# Patient Record
Sex: Female | Born: 1950 | Race: White | Hispanic: No | Smoking: Never smoker
Health system: Southern US, Community
[De-identification: ages and names within clinical notes are randomized; demographics above are authoritative.]

## PROBLEM LIST (undated history)

## (undated) DIAGNOSIS — I10 Essential (primary) hypertension: Secondary | ICD-10-CM

## (undated) DIAGNOSIS — J45909 Unspecified asthma, uncomplicated: Secondary | ICD-10-CM

## (undated) DIAGNOSIS — D649 Anemia, unspecified: Secondary | ICD-10-CM

## (undated) DIAGNOSIS — R011 Cardiac murmur, unspecified: Secondary | ICD-10-CM

## (undated) DIAGNOSIS — E785 Hyperlipidemia, unspecified: Secondary | ICD-10-CM

## (undated) DIAGNOSIS — K862 Cyst of pancreas: Secondary | ICD-10-CM

## (undated) DIAGNOSIS — R002 Palpitations: Secondary | ICD-10-CM

## (undated) DIAGNOSIS — I340 Nonrheumatic mitral (valve) insufficiency: Secondary | ICD-10-CM

## (undated) DIAGNOSIS — K863 Pseudocyst of pancreas: Secondary | ICD-10-CM

## (undated) DIAGNOSIS — K219 Gastro-esophageal reflux disease without esophagitis: Secondary | ICD-10-CM

## (undated) DIAGNOSIS — K589 Irritable bowel syndrome without diarrhea: Secondary | ICD-10-CM

## (undated) DIAGNOSIS — E041 Nontoxic single thyroid nodule: Secondary | ICD-10-CM

## (undated) DIAGNOSIS — R059 Cough, unspecified: Secondary | ICD-10-CM

## (undated) DIAGNOSIS — M1990 Unspecified osteoarthritis, unspecified site: Secondary | ICD-10-CM

## (undated) DIAGNOSIS — T7840XA Allergy, unspecified, initial encounter: Secondary | ICD-10-CM

## (undated) DIAGNOSIS — F419 Anxiety disorder, unspecified: Secondary | ICD-10-CM

## (undated) DIAGNOSIS — E1129 Type 2 diabetes mellitus with other diabetic kidney complication: Secondary | ICD-10-CM

## (undated) DIAGNOSIS — I209 Angina pectoris, unspecified: Secondary | ICD-10-CM

## (undated) DIAGNOSIS — N200 Calculus of kidney: Secondary | ICD-10-CM

## (undated) DIAGNOSIS — R062 Wheezing: Secondary | ICD-10-CM

## (undated) DIAGNOSIS — R05 Cough: Secondary | ICD-10-CM

## (undated) DIAGNOSIS — Z6841 Body Mass Index (BMI) 40.0 and over, adult: Secondary | ICD-10-CM

## (undated) HISTORY — PX: TONSILLECTOMY: SUR1361

## (undated) HISTORY — DX: Pseudocyst of pancreas: K86.3

## (undated) HISTORY — DX: Gastro-esophageal reflux disease without esophagitis: K21.9

## (undated) HISTORY — DX: Nonrheumatic mitral (valve) insufficiency: I34.0

## (undated) HISTORY — DX: Body Mass Index (BMI) 40.0 and over, adult: Z684

## (undated) HISTORY — DX: Calculus of kidney: N20.0

## (undated) HISTORY — DX: Essential (primary) hypertension: I10

## (undated) HISTORY — DX: Anemia, unspecified: D64.9

## (undated) HISTORY — PX: FRACTURE SURGERY: SHX138

## (undated) HISTORY — DX: Hyperlipidemia, unspecified: E78.5

## (undated) HISTORY — PX: DILATION AND CURETTAGE OF UTERUS: SHX78

## (undated) HISTORY — DX: Unspecified osteoarthritis, unspecified site: M19.90

## (undated) HISTORY — DX: Type 2 diabetes mellitus with other diabetic kidney complication: E11.29

## (undated) HISTORY — DX: Allergy, unspecified, initial encounter: T78.40XA

## (undated) HISTORY — DX: Unspecified asthma, uncomplicated: J45.909

## (undated) HISTORY — DX: Cyst of pancreas: K86.2

## (undated) HISTORY — DX: Anxiety disorder, unspecified: F41.9

## (undated) HISTORY — PX: TONSILLECTOMY AND ADENOIDECTOMY: SHX28

---

## 2002-10-03 HISTORY — PX: COLONOSCOPY: SHX174

## 2002-10-03 HISTORY — PX: ABDOMINAL HYSTERECTOMY: SHX81

## 2007-11-27 ENCOUNTER — Ambulatory Visit: Payer: Self-pay | Admitting: Otolaryngology

## 2008-01-08 ENCOUNTER — Ambulatory Visit: Payer: Self-pay | Admitting: Vascular Surgery

## 2008-04-21 ENCOUNTER — Ambulatory Visit: Payer: Self-pay | Admitting: Specialist

## 2008-11-11 ENCOUNTER — Ambulatory Visit: Payer: Self-pay | Admitting: Otolaryngology

## 2009-05-18 ENCOUNTER — Ambulatory Visit: Payer: Self-pay | Admitting: Otolaryngology

## 2010-05-31 ENCOUNTER — Ambulatory Visit: Payer: Self-pay | Admitting: Otolaryngology

## 2011-02-23 ENCOUNTER — Ambulatory Visit: Payer: Self-pay | Admitting: Nephrology

## 2011-04-26 ENCOUNTER — Ambulatory Visit: Payer: Self-pay | Admitting: Nephrology

## 2011-10-04 HISTORY — PX: CHOLECYSTECTOMY: SHX55

## 2012-05-23 ENCOUNTER — Ambulatory Visit: Payer: Self-pay | Admitting: Family Medicine

## 2012-06-07 ENCOUNTER — Ambulatory Visit: Payer: Self-pay | Admitting: Family Medicine

## 2012-08-23 DIAGNOSIS — E669 Obesity, unspecified: Secondary | ICD-10-CM | POA: Insufficient documentation

## 2012-12-10 ENCOUNTER — Ambulatory Visit: Payer: Self-pay | Admitting: Family Medicine

## 2012-12-26 ENCOUNTER — Other Ambulatory Visit: Payer: Self-pay

## 2012-12-26 ENCOUNTER — Encounter: Payer: Self-pay | Admitting: General Surgery

## 2012-12-26 ENCOUNTER — Ambulatory Visit (INDEPENDENT_AMBULATORY_CARE_PROVIDER_SITE_OTHER): Payer: 59 | Admitting: General Surgery

## 2012-12-26 VITALS — BP 128/66 | HR 64 | Resp 16 | Ht 64.0 in | Wt 258.0 lb

## 2012-12-26 DIAGNOSIS — R928 Other abnormal and inconclusive findings on diagnostic imaging of breast: Secondary | ICD-10-CM

## 2012-12-26 DIAGNOSIS — I10 Essential (primary) hypertension: Secondary | ICD-10-CM

## 2012-12-26 DIAGNOSIS — E119 Type 2 diabetes mellitus without complications: Secondary | ICD-10-CM

## 2012-12-26 DIAGNOSIS — N63 Unspecified lump in unspecified breast: Secondary | ICD-10-CM

## 2012-12-26 DIAGNOSIS — E7800 Pure hypercholesterolemia, unspecified: Secondary | ICD-10-CM

## 2012-12-26 NOTE — Patient Instructions (Addendum)
CARE AFTER BREAST BIOPSY  1. Leave the dressing on that your doctor applied after surgery. It is waterproof. You may bathe, shower and/or swim. The dressing will probably remain intact until your return office visit. If the dressing comes off, you will see small strips of tape against your skin on the incision. Do not remove these strips.  2. You may want to use a gauze,cloth or similar protection in your bra to prevent rubbing against your dressing and incision. This is not necessary, but you may feel more comfortable doing so.  3. It is recommended that you wear a bra day and night to give support to the breast. This will prevent the weight of the breast from pulling on the incision.  4. Your breast will feel hard and lumpy under the incision. Do not be alarmed. This is the underlying stitching of tissue. Softening of this tissue will occur in time.  5. Make sure you call the office and schedule an appointment in one week after your surgery. The office phone number is [phone]. The [facility] may have already done this for you.  6. You will notice about a week after your office visit that the strips of the tape on your incision will begin to loosen. These may then be removed.  7. Report to your doctor any of the following:  * Severe pain not relieved by your pain medication  *Redness of the incision  * Drainage from the incision  *Fever greater than 101 degrees    Patient aware that once lab results are in she will receive a phone call from our office with results.

## 2012-12-26 NOTE — Progress Notes (Addendum)
Patient ID: Melissa Singh, female   DOB: 1951-04-16, 62 y.o.   MRN: [id number]  Chief Complaint  Patient presents with  . Follow-up    new patient follow up mammogram    HPI Melissa Singh is a 62 y.o. female.  The patient presents for a follow mammogram which was done on 12/10/12 at [facility] birad category 4. Patient states no known family or personal history of breast problems. She does get yearly mammograms and does regular self breast checks. No current problems with her breasts.  HPI  Past Medical History  Diagnosis Date  . Asthma   . Anemia   . Diarrhea   . Arthritis   . Kidney stone   . Sinus problem   . Hypertension   . Diabetes mellitus without complication 2001-2002    Past Surgical History  Procedure Laterality Date  . Cholecystectomy  2013  . Abdominal hysterectomy  2004    History reviewed. No pertinent family history.  Social History History  Substance Use Topics  . Smoking status: Never Smoker   . Smokeless tobacco: Never Used  . Alcohol Use: No    Allergies  Allergen Reactions  . Hydrochlorothiazide Cough  . Ultram (Tramadol) Nausea And Vomiting  . Latex Rash    Current Outpatient Prescriptions  Medication Sig Dispense Refill  . atenolol (TENORMIN) 100 MG tablet Take 100 mg by mouth daily.      Marland Kitchen azelastine (ASTELIN) 137 MCG/SPRAY nasal spray Place 1 spray into the nose 2 (two) times daily. Use in each nostril as directed      . colesevelam (WELCHOL) 625 MG tablet Take 1,875 mg by mouth 2 (two) times daily with a meal.      . Colesevelam HCl (WELCHOL PO) Take 1 tablet by mouth daily.      Marland Kitchen diltiazem (DILACOR XR) 240 MG 24 hr capsule Take 240 mg by mouth daily.      . insulin aspart (NOVOLOG) 100 UNIT/ML injection Inject 45 Units into the skin daily.       . insulin glargine (LANTUS) 100 UNIT/ML injection Inject 25 Units into the skin at bedtime.       Marland Kitchen loratadine (CLARITIN) 10 MG tablet Take 10 mg by mouth daily.      . montelukast (SINGULAIR) 10 MG  tablet Take 10 mg by mouth at bedtime.      . naproxen sodium (ANAPROX) 220 MG tablet Take 220 mg by mouth 2 (two) times daily with a meal.      . pioglitazone (ACTOS) 45 MG tablet Take 45 mg by mouth daily.      . simvastatin (ZOCOR) 40 MG tablet Take 40 mg by mouth every evening.       No current facility-administered medications for this visit.    Review of Systems Review of Systems  Constitutional: Negative.   Respiratory: Negative.   Cardiovascular: Negative.     Blood pressure 128/66, pulse 64, resp. rate 16, height 5\' 4"  (1.626 m), weight 258 lb (117.028 kg).  Physical Exam Physical Exam  Constitutional: She appears well-developed and well-nourished.  Neck: Trachea normal. No mass and no thyromegaly present.  Cardiovascular: Normal rate, regular rhythm and normal pulses.   Murmur heard. Pulmonary/Chest: Effort normal and breath sounds normal. Right breast exhibits no inverted nipple, no mass, no nipple discharge, no skin change and no tenderness. Left breast exhibits no inverted nipple, no mass, no nipple discharge, no skin change and no tenderness. Breasts are symmetrical.  Lymphadenopathy:  She has no axillary adenopathy.    Data Reviewed Laboratory studies completed 11/19/2012 included a serum creatinine. This was elevated at 1.13 with an estimated GFR 52.  Right breast mammogram dated 12/10/2012 showed a slightly more spiculated appearance compared to her in 06/07/2012 exam ultrasound showed a primary linear orientation with primarily anechoic architecture. Linear areas of increased echogenicity were again appreciated. Absent vascularity. Unchanged from past exams. Mammogram was rated a BIRAD-4.  Office ultrasound confirmed the above lesion as being present. Considering the mammographic rating back and biopsy was recommended.  Assessment    Breast nodule, mild renal impairment, non-insulin-dependent diabetes, elevated cholesterol, essential hypertension.    Plan     The patient was advised that she should minimize her use of nonsteroidals in light of her renal function.  Vacuum biopsy of the right breast was completed today without incident.       Earline Mayotte 12/29/2012, 11:34 AM    The pathology report for the biopsy completed 12/26/2012 showed no evidence of malignancy. PATH REPORT.SITE OF ORIGIN SPEC  Comment   Comments: Material submitted: . BREAST, RIGHT 5:00 CORE BIOPSY *STAT* PATH REPORT.FINAL DX SPEC  Comment   Comments: Clinician provided ICD-9: 611.72 ; Lump or mass in breast 793.80 ; Unspecified abnormal mammogram PATH REPORT.FINAL DX SPEC  Comment   Comments: Diagnosis: RIGHT BREAST AT 5:00, ULTRASOUND-GUIDED CORE BIOPSY: - BENIGN BREAST TISSUE WITH FOCAL FIBROSIS. NOTE: No epithelial proliferative change, atypia, or malignancy is seen in this specimen. The adipose tissue contains a few foci of interstitial macrophages, which could be compatible with fat necrosis, but I do not see sufficient histologic features for a specific diagnosis. Clinical correlation and follow-up are recommended. The diagnosis was reported to Dr. Lemar Livings at 1:35 PM on December 27, 2012. MSO/12/27/2012 SIGNED OUT BY:  Comment   Comments: Electronically signed: Marland Kitchen Vernona Rieger, MD, Pathologist  The patient has been notified of the results. Arrangements are in place for a follow up examination with a right breast mammogram in 6 months.

## 2012-12-27 ENCOUNTER — Telehealth: Payer: Self-pay | Admitting: General Surgery

## 2012-12-27 LAB — PATHOLOGY

## 2012-12-27 NOTE — Telephone Encounter (Signed)
Patient notified of benign results. Will follow up next week for wound check with the nurse.  MD fu in six months with repeat mammogram.

## 2012-12-29 ENCOUNTER — Encounter: Payer: Self-pay | Admitting: General Surgery

## 2012-12-29 DIAGNOSIS — E119 Type 2 diabetes mellitus without complications: Secondary | ICD-10-CM | POA: Insufficient documentation

## 2012-12-29 DIAGNOSIS — E7800 Pure hypercholesterolemia, unspecified: Secondary | ICD-10-CM | POA: Insufficient documentation

## 2012-12-29 DIAGNOSIS — I10 Essential (primary) hypertension: Secondary | ICD-10-CM | POA: Insufficient documentation

## 2013-01-02 ENCOUNTER — Ambulatory Visit (INDEPENDENT_AMBULATORY_CARE_PROVIDER_SITE_OTHER): Payer: 59 | Admitting: *Deleted

## 2013-01-02 DIAGNOSIS — N63 Unspecified lump in unspecified breast: Secondary | ICD-10-CM

## 2013-01-02 NOTE — Patient Instructions (Addendum)
Follow up as scheduled for 6 months

## 2013-01-02 NOTE — Progress Notes (Signed)
Patient here today for follow up post right  breast biopsy.  Dressing removed, steristrip in place and aware it may come off in one week.  Minimal bruising noted.  The patient is aware that a heating pad may be used for comfort as needed.  Aware of pathology. Follow up as scheduled. 

## 2013-06-11 ENCOUNTER — Ambulatory Visit: Payer: Self-pay | Admitting: General Surgery

## 2013-06-13 ENCOUNTER — Encounter: Payer: Self-pay | Admitting: General Surgery

## 2013-06-24 ENCOUNTER — Ambulatory Visit: Payer: 59 | Admitting: General Surgery

## 2013-06-24 ENCOUNTER — Encounter: Payer: Self-pay | Admitting: General Surgery

## 2013-06-24 ENCOUNTER — Ambulatory Visit (INDEPENDENT_AMBULATORY_CARE_PROVIDER_SITE_OTHER): Payer: 59 | Admitting: General Surgery

## 2013-06-24 VITALS — BP 136/70 | HR 78 | Resp 14 | Ht 64.0 in | Wt 254.0 lb

## 2013-06-24 DIAGNOSIS — N63 Unspecified lump in unspecified breast: Secondary | ICD-10-CM

## 2013-06-24 DIAGNOSIS — Z1211 Encounter for screening for malignant neoplasm of colon: Secondary | ICD-10-CM

## 2013-06-24 MED ORDER — POLYETHYLENE GLYCOL 3350 17 GM/SCOOP PO POWD: ORAL | Status: DC

## 2013-06-24 NOTE — Progress Notes (Signed)
Patient ID: Melissa Singh, female   DOB: 21-Sep-1951, 62 y.o.   MRN: [id number]  Chief Complaint  Patient presents with  . Follow-up    mammogram    HPI Melissa Singh is a 62 y.o. female who presents for a breast evaluation. The most recent mammogram was done on 06/13/13 cat 2.  Patient does perform regular self breast checks and gets regular mammograms done.    HPI  Past Medical History  Diagnosis Date  . Asthma   . Anemia   . Diarrhea   . Arthritis   . Kidney stone   . Sinus problem   . Hypertension   . Diabetes mellitus without complication 2001-2002    Past Surgical History  Procedure Laterality Date  . Cholecystectomy  2013  . Abdominal hysterectomy  2004  . Colonoscopy  2004    History reviewed. No pertinent family history.  Social History History  Substance Use Topics  . Smoking status: Never Smoker   . Smokeless tobacco: Never Used  . Alcohol Use: No    Allergies  Allergen Reactions  . Hydrochlorothiazide Cough  . Ultram [Tramadol] Nausea And Vomiting  . Latex Rash    Current Outpatient Prescriptions  Medication Sig Dispense Refill  . atenolol (TENORMIN) 100 MG tablet Take 100 mg by mouth daily.      Marland Kitchen azelastine (ASTELIN) 137 MCG/SPRAY nasal spray Place 1 spray into the nose 2 (two) times daily. Use in each nostril as directed      . colesevelam (WELCHOL) 625 MG tablet Take 1,875 mg by mouth 2 (two) times daily with a meal.      . Colesevelam HCl (WELCHOL PO) Take 1 tablet by mouth daily.      Marland Kitchen diltiazem (CARDIZEM CD) 240 MG 24 hr capsule Take 1 capsule by mouth daily.      Marland Kitchen diltiazem (DILACOR XR) 240 MG 24 hr capsule Take 240 mg by mouth daily.      Marland Kitchen FARXIGA 5 MG TABS Take 1 tablet by mouth daily.      . insulin aspart (NOVOLOG) 100 UNIT/ML injection Inject 45 Units into the skin daily.       . insulin glargine (LANTUS) 100 UNIT/ML injection Inject 25 Units into the skin at bedtime.       Marland Kitchen loratadine (CLARITIN) 10 MG tablet Take 10 mg by mouth  daily.      . meloxicam (MOBIC) 15 MG tablet Take 1 tablet by mouth daily.      . montelukast (SINGULAIR) 10 MG tablet Take 10 mg by mouth at bedtime.      . naproxen sodium (ANAPROX) 220 MG tablet Take 220 mg by mouth 2 (two) times daily with a meal.      . pioglitazone (ACTOS) 45 MG tablet Take 45 mg by mouth daily.      . simvastatin (ZOCOR) 40 MG tablet Take 40 mg by mouth every evening.      . polyethylene glycol powder (GLYCOLAX/MIRALAX) powder 255 grams one bottle for colonoscopy prep  255 g  0   No current facility-administered medications for this visit.    Review of Systems Review of Systems  Constitutional: Negative.   Respiratory: Negative.   Cardiovascular: Negative.   Gastrointestinal: Negative.     Blood pressure 136/70, pulse 78, resp. rate 14, height 5\' 4"  (1.626 m), weight 254 lb (115.214 kg).  Physical Exam Physical Exam  Constitutional: She is oriented to person, place, and time. She appears well-developed and well-nourished.  Cardiovascular:  Normal rate, regular rhythm and normal heart sounds.   Pulmonary/Chest: Breath sounds normal. Right breast exhibits no inverted nipple, no mass, no nipple discharge, no skin change and no tenderness. Left breast exhibits no inverted nipple, no mass, no nipple discharge, no skin change and no tenderness.  Lymphadenopathy:    She has no cervical adenopathy.    She has no axillary adenopathy.  Neurological: She is alert and oriented to person, place, and time.  Skin: Skin is warm and dry.    Data Reviewed Bilateral mammogram and ultrasound of the right breast is 06/11/2013 was reviewed. Left breast is unremarkable. The right breast shows minimal residual nodularity with a biopsy clip adjacent. Microcalcifications. BI-RAD-2.  Colonoscopy dated 08/30/2002 completed for iron deficiency anemia was normal.  Upper endoscopy completed the same date show some granular mucosa in the second portion of the duodenum.  Small bowel  follow-through showed no evidence of disease or source for blood loss dated 09/03/2002.  Assessment    Benign breast exam. Stable mammogram. 10 year since previous colonoscopy.    Plan    The patient desired to proceed with a screening colonoscopy. Her evening NovoLog insulin will be decreased to 10 units evening of the prep. Her bedtime dose of Lantus will be decreased to 10 units. She will continue on Actos.    Patient has been scheduled for a colonoscopy on 08-14-13 at [facility]. This patient has been asked to decrease Novolog to 10 units, Lantus to 10 units, and okay to take Actos as she normally would day of colonoscopy prep. She will hold diabetic meds the morning of procedure.   Earline Mayotte 06/25/2013, 9:50 AM

## 2013-06-24 NOTE — Patient Instructions (Addendum)
Colonoscopy A colonoscopy is an exam to evaluate your entire colon. In this exam, your colon is cleansed. A Preble fiberoptic tube is inserted through your rectum and into your colon. The fiberoptic scope (endoscope) is a Futch bundle of enclosed and very flexible fibers. These fibers transmit light to the area examined and send images from that area to your caregiver. Discomfort is usually minimal. You may be given a drug to help you sleep (sedative) during or prior to the procedure. This exam helps to detect lumps (tumors), polyps, inflammation, and areas of bleeding. Your caregiver may also take a small piece of tissue (biopsy) that will be examined under a microscope. LET YOUR CAREGIVER KNOW ABOUT:   Allergies to food or medicine.  Medicines taken, including vitamins, herbs, eyedrops, over-the-counter medicines, and creams.  Use of steroids (by mouth or creams).  Previous problems with anesthetics or numbing medicines.  History of bleeding problems or blood clots.  Previous surgery.  Other health problems, including diabetes and kidney problems.  Possibility of pregnancy, if this applies. BEFORE THE PROCEDURE   A clear liquid diet may be required for 2 days before the exam.  Ask your caregiver about changing or stopping your regular medications.  Liquid injections (enemas) or laxatives may be required.  A large amount of electrolyte solution may be given to you to drink over a short period of time. This solution is used to clean out your colon.  You should be present 60 minutes prior to your procedure or as directed by your caregiver. AFTER THE PROCEDURE   If you received a sedative or pain relieving medication, you will need to arrange for someone to drive you home.  Occasionally, there is a little blood passed with the first bowel movement. Do not be concerned. FINDING OUT THE RESULTS OF YOUR TEST Not all test results are available during your visit. If your test results are  not back during the visit, make an appointment with your caregiver to find out the results. Do not assume everything is normal if you have not heard from your caregiver or the medical facility. It is important for you to follow up on all of your test results. HOME CARE INSTRUCTIONS   It is not unusual to pass moderate amounts of gas and experience mild abdominal cramping following the procedure. This is due to air being used to inflate your colon during the exam. Walking or a warm pack on your belly (abdomen) may help.  You may resume all normal meals and activities after sedatives and medicines have worn off.  Only take over-the-counter or prescription medicines for pain, discomfort, or fever as directed by your caregiver. Do not use aspirin or blood thinners if a biopsy was taken. Consult your caregiver for medicine usage if biopsies were taken. SEEK IMMEDIATE MEDICAL CARE IF:   You have a fever.  You pass large blood clots or fill a toilet with blood following the procedure. This may also occur 10 to 14 days following the procedure. This is more likely if a biopsy was taken.  You develop abdominal pain that keeps getting worse and cannot be relieved with medicine. Document Released: 09/16/2000 Document Revised: 12/12/2011 Document Reviewed: 05/01/2008 Exit[employer] Patient Information 2014 [employer].  Patient has been scheduled for a colonoscopy on 08-14-13 at [facility]. 

## 2013-06-25 ENCOUNTER — Other Ambulatory Visit: Payer: Self-pay | Admitting: General Surgery

## 2013-06-25 ENCOUNTER — Encounter: Payer: Self-pay | Admitting: General Surgery

## 2013-06-25 DIAGNOSIS — Z1211 Encounter for screening for malignant neoplasm of colon: Secondary | ICD-10-CM

## 2013-08-08 ENCOUNTER — Telehealth: Payer: Self-pay | Admitting: *Deleted

## 2013-08-08 ENCOUNTER — Other Ambulatory Visit: Payer: Self-pay

## 2013-08-08 NOTE — Telephone Encounter (Signed)
Left message on home and cell numbers for patient to call the office.  We need to confirm no medication changes since last office visit. She is presently scheduled for a colonoscopy on 08-14-13 at [facility].

## 2013-08-09 NOTE — Telephone Encounter (Signed)
Spoke with patient and confirmed that she has had no medication changes. She says that she has pre registered at [facility] and she has picked up her Mira lax prescription. She is aware of instructions and date of colonoscopy. Patient will call us with any questions or changes in medical status.

## 2013-08-14 ENCOUNTER — Ambulatory Visit: Payer: Self-pay | Admitting: General Surgery

## 2013-08-14 DIAGNOSIS — Z1211 Encounter for screening for malignant neoplasm of colon: Secondary | ICD-10-CM

## 2013-08-15 ENCOUNTER — Encounter: Payer: Self-pay | Admitting: General Surgery

## 2014-06-16 DIAGNOSIS — J45909 Unspecified asthma, uncomplicated: Secondary | ICD-10-CM | POA: Diagnosis not present

## 2014-06-26 DIAGNOSIS — I059 Rheumatic mitral valve disease, unspecified: Secondary | ICD-10-CM | POA: Diagnosis not present

## 2014-06-26 DIAGNOSIS — N182 Chronic kidney disease, stage 2 (mild): Secondary | ICD-10-CM | POA: Diagnosis not present

## 2014-06-26 DIAGNOSIS — E1129 Type 2 diabetes mellitus with other diabetic kidney complication: Secondary | ICD-10-CM | POA: Diagnosis not present

## 2014-06-26 DIAGNOSIS — E785 Hyperlipidemia, unspecified: Secondary | ICD-10-CM | POA: Diagnosis not present

## 2014-06-26 DIAGNOSIS — Z23 Encounter for immunization: Secondary | ICD-10-CM | POA: Diagnosis not present

## 2014-06-26 DIAGNOSIS — E1165 Type 2 diabetes mellitus with hyperglycemia: Secondary | ICD-10-CM | POA: Diagnosis not present

## 2014-06-26 DIAGNOSIS — I10 Essential (primary) hypertension: Secondary | ICD-10-CM | POA: Diagnosis not present

## 2014-06-26 DIAGNOSIS — E119 Type 2 diabetes mellitus without complications: Secondary | ICD-10-CM | POA: Diagnosis not present

## 2014-06-26 DIAGNOSIS — Z0000 Encounter for general adult medical examination without abnormal findings: Secondary | ICD-10-CM | POA: Diagnosis not present

## 2014-06-26 DIAGNOSIS — Z6841 Body Mass Index (BMI) 40.0 and over, adult: Secondary | ICD-10-CM | POA: Diagnosis not present

## 2014-07-07 ENCOUNTER — Ambulatory Visit: Payer: Self-pay | Admitting: Family Medicine

## 2014-07-07 DIAGNOSIS — Z8249 Family history of ischemic heart disease and other diseases of the circulatory system: Secondary | ICD-10-CM | POA: Diagnosis not present

## 2014-07-07 DIAGNOSIS — Z136 Encounter for screening for cardiovascular disorders: Secondary | ICD-10-CM | POA: Diagnosis not present

## 2014-07-21 DIAGNOSIS — K219 Gastro-esophageal reflux disease without esophagitis: Secondary | ICD-10-CM | POA: Diagnosis not present

## 2014-07-21 DIAGNOSIS — E119 Type 2 diabetes mellitus without complications: Secondary | ICD-10-CM | POA: Diagnosis not present

## 2014-07-21 DIAGNOSIS — I38 Endocarditis, valve unspecified: Secondary | ICD-10-CM | POA: Diagnosis not present

## 2014-07-21 DIAGNOSIS — I10 Essential (primary) hypertension: Secondary | ICD-10-CM | POA: Diagnosis not present

## 2014-08-04 ENCOUNTER — Encounter: Payer: Self-pay | Admitting: General Surgery

## 2014-09-01 ENCOUNTER — Ambulatory Visit: Payer: Self-pay | Admitting: Family Medicine

## 2014-09-01 DIAGNOSIS — M898X9 Other specified disorders of bone, unspecified site: Secondary | ICD-10-CM | POA: Diagnosis not present

## 2014-09-01 DIAGNOSIS — Z8249 Family history of ischemic heart disease and other diseases of the circulatory system: Secondary | ICD-10-CM | POA: Diagnosis not present

## 2014-09-01 DIAGNOSIS — Z780 Asymptomatic menopausal state: Secondary | ICD-10-CM | POA: Diagnosis not present

## 2014-09-01 DIAGNOSIS — Z13820 Encounter for screening for osteoporosis: Secondary | ICD-10-CM | POA: Diagnosis not present

## 2014-09-01 DIAGNOSIS — Z1231 Encounter for screening mammogram for malignant neoplasm of breast: Secondary | ICD-10-CM | POA: Diagnosis not present

## 2014-09-01 DIAGNOSIS — M8580 Other specified disorders of bone density and structure, unspecified site: Secondary | ICD-10-CM | POA: Diagnosis not present

## 2014-09-15 DIAGNOSIS — L561 Drug photoallergic response: Secondary | ICD-10-CM | POA: Diagnosis not present

## 2014-09-15 DIAGNOSIS — J45909 Unspecified asthma, uncomplicated: Secondary | ICD-10-CM | POA: Diagnosis not present

## 2014-09-15 DIAGNOSIS — I10 Essential (primary) hypertension: Secondary | ICD-10-CM | POA: Diagnosis not present

## 2014-09-15 DIAGNOSIS — K863 Pseudocyst of pancreas: Secondary | ICD-10-CM | POA: Diagnosis not present

## 2014-09-15 DIAGNOSIS — Z6841 Body Mass Index (BMI) 40.0 and over, adult: Secondary | ICD-10-CM | POA: Diagnosis not present

## 2014-09-15 DIAGNOSIS — F419 Anxiety disorder, unspecified: Secondary | ICD-10-CM | POA: Diagnosis not present

## 2014-09-15 DIAGNOSIS — E1129 Type 2 diabetes mellitus with other diabetic kidney complication: Secondary | ICD-10-CM | POA: Diagnosis not present

## 2014-09-15 DIAGNOSIS — E785 Hyperlipidemia, unspecified: Secondary | ICD-10-CM | POA: Diagnosis not present

## 2014-12-22 DIAGNOSIS — E782 Mixed hyperlipidemia: Secondary | ICD-10-CM | POA: Diagnosis not present

## 2014-12-22 DIAGNOSIS — I340 Nonrheumatic mitral (valve) insufficiency: Secondary | ICD-10-CM | POA: Diagnosis not present

## 2014-12-22 DIAGNOSIS — I10 Essential (primary) hypertension: Secondary | ICD-10-CM | POA: Diagnosis not present

## 2014-12-29 DIAGNOSIS — I10 Essential (primary) hypertension: Secondary | ICD-10-CM | POA: Diagnosis not present

## 2014-12-29 DIAGNOSIS — E1129 Type 2 diabetes mellitus with other diabetic kidney complication: Secondary | ICD-10-CM | POA: Diagnosis not present

## 2014-12-29 DIAGNOSIS — E785 Hyperlipidemia, unspecified: Secondary | ICD-10-CM | POA: Diagnosis not present

## 2015-01-12 DIAGNOSIS — J4520 Mild intermittent asthma, uncomplicated: Secondary | ICD-10-CM | POA: Diagnosis not present

## 2015-01-12 DIAGNOSIS — E6609 Other obesity due to excess calories: Secondary | ICD-10-CM | POA: Diagnosis not present

## 2015-01-12 DIAGNOSIS — J310 Chronic rhinitis: Secondary | ICD-10-CM | POA: Diagnosis not present

## 2015-01-12 DIAGNOSIS — I10 Essential (primary) hypertension: Secondary | ICD-10-CM | POA: Diagnosis not present

## 2015-01-12 DIAGNOSIS — E782 Mixed hyperlipidemia: Secondary | ICD-10-CM | POA: Diagnosis not present

## 2015-01-12 DIAGNOSIS — R42 Dizziness and giddiness: Secondary | ICD-10-CM | POA: Diagnosis not present

## 2015-02-20 DIAGNOSIS — K862 Cyst of pancreas: Secondary | ICD-10-CM | POA: Diagnosis not present

## 2015-02-20 DIAGNOSIS — Z9049 Acquired absence of other specified parts of digestive tract: Secondary | ICD-10-CM | POA: Diagnosis not present

## 2015-02-20 DIAGNOSIS — N281 Cyst of kidney, acquired: Secondary | ICD-10-CM | POA: Diagnosis not present

## 2015-03-01 DIAGNOSIS — S52572A Other intraarticular fracture of lower end of left radius, initial encounter for closed fracture: Secondary | ICD-10-CM | POA: Diagnosis not present

## 2015-03-01 DIAGNOSIS — S52502A Unspecified fracture of the lower end of left radius, initial encounter for closed fracture: Secondary | ICD-10-CM | POA: Diagnosis not present

## 2015-03-01 DIAGNOSIS — M19032 Primary osteoarthritis, left wrist: Secondary | ICD-10-CM | POA: Diagnosis not present

## 2015-03-01 DIAGNOSIS — M8580 Other specified disorders of bone density and structure, unspecified site: Secondary | ICD-10-CM | POA: Diagnosis not present

## 2015-03-01 DIAGNOSIS — S52615A Nondisplaced fracture of left ulna styloid process, initial encounter for closed fracture: Secondary | ICD-10-CM | POA: Diagnosis not present

## 2015-03-01 DIAGNOSIS — S52512A Displaced fracture of left radial styloid process, initial encounter for closed fracture: Secondary | ICD-10-CM | POA: Diagnosis not present

## 2015-03-01 DIAGNOSIS — S52612A Displaced fracture of left ulna styloid process, initial encounter for closed fracture: Secondary | ICD-10-CM | POA: Diagnosis not present

## 2015-03-04 HISTORY — PX: OTHER SURGICAL HISTORY: SHX169

## 2015-03-06 DIAGNOSIS — M25532 Pain in left wrist: Secondary | ICD-10-CM | POA: Diagnosis not present

## 2015-03-06 DIAGNOSIS — S52502A Unspecified fracture of the lower end of left radius, initial encounter for closed fracture: Secondary | ICD-10-CM | POA: Diagnosis not present

## 2015-03-09 DIAGNOSIS — S52572A Other intraarticular fracture of lower end of left radius, initial encounter for closed fracture: Secondary | ICD-10-CM | POA: Diagnosis not present

## 2015-03-12 DIAGNOSIS — I10 Essential (primary) hypertension: Secondary | ICD-10-CM | POA: Diagnosis not present

## 2015-03-12 DIAGNOSIS — S52502A Unspecified fracture of the lower end of left radius, initial encounter for closed fracture: Secondary | ICD-10-CM | POA: Diagnosis not present

## 2015-03-12 DIAGNOSIS — E119 Type 2 diabetes mellitus without complications: Secondary | ICD-10-CM | POA: Diagnosis not present

## 2015-03-12 DIAGNOSIS — S52502D Unspecified fracture of the lower end of left radius, subsequent encounter for closed fracture with routine healing: Secondary | ICD-10-CM | POA: Diagnosis not present

## 2015-03-12 DIAGNOSIS — I340 Nonrheumatic mitral (valve) insufficiency: Secondary | ICD-10-CM | POA: Diagnosis not present

## 2015-03-13 DIAGNOSIS — I340 Nonrheumatic mitral (valve) insufficiency: Secondary | ICD-10-CM | POA: Diagnosis not present

## 2015-03-13 DIAGNOSIS — S52502A Unspecified fracture of the lower end of left radius, initial encounter for closed fracture: Secondary | ICD-10-CM | POA: Diagnosis not present

## 2015-03-13 DIAGNOSIS — S52572A Other intraarticular fracture of lower end of left radius, initial encounter for closed fracture: Secondary | ICD-10-CM | POA: Diagnosis not present

## 2015-03-13 DIAGNOSIS — I272 Other secondary pulmonary hypertension: Secondary | ICD-10-CM | POA: Diagnosis not present

## 2015-03-13 DIAGNOSIS — Z91040 Latex allergy status: Secondary | ICD-10-CM | POA: Diagnosis not present

## 2015-03-13 DIAGNOSIS — Z888 Allergy status to other drugs, medicaments and biological substances status: Secondary | ICD-10-CM | POA: Diagnosis not present

## 2015-03-13 DIAGNOSIS — Z794 Long term (current) use of insulin: Secondary | ICD-10-CM | POA: Diagnosis not present

## 2015-03-13 DIAGNOSIS — Z6841 Body Mass Index (BMI) 40.0 and over, adult: Secondary | ICD-10-CM | POA: Diagnosis not present

## 2015-03-13 DIAGNOSIS — Z79899 Other long term (current) drug therapy: Secondary | ICD-10-CM | POA: Diagnosis not present

## 2015-03-13 DIAGNOSIS — E119 Type 2 diabetes mellitus without complications: Secondary | ICD-10-CM | POA: Diagnosis not present

## 2015-03-13 DIAGNOSIS — I10 Essential (primary) hypertension: Secondary | ICD-10-CM | POA: Diagnosis not present

## 2015-03-27 DIAGNOSIS — M25632 Stiffness of left wrist, not elsewhere classified: Secondary | ICD-10-CM | POA: Diagnosis not present

## 2015-03-27 DIAGNOSIS — S62102S Fracture of unspecified carpal bone, left wrist, sequela: Secondary | ICD-10-CM | POA: Diagnosis not present

## 2015-03-27 DIAGNOSIS — E1129 Type 2 diabetes mellitus with other diabetic kidney complication: Secondary | ICD-10-CM | POA: Insufficient documentation

## 2015-03-27 DIAGNOSIS — Z736 Limitation of activities due to disability: Secondary | ICD-10-CM | POA: Diagnosis not present

## 2015-03-27 DIAGNOSIS — R600 Localized edema: Secondary | ICD-10-CM | POA: Diagnosis not present

## 2015-03-30 ENCOUNTER — Encounter: Payer: Self-pay | Admitting: Family Medicine

## 2015-03-30 ENCOUNTER — Ambulatory Visit (INDEPENDENT_AMBULATORY_CARE_PROVIDER_SITE_OTHER): Payer: Medicare Other | Admitting: Family Medicine

## 2015-03-30 VITALS — BP 124/72 | HR 73 | Temp 97.8°F | Ht 65.0 in | Wt 251.0 lb

## 2015-03-30 DIAGNOSIS — I10 Essential (primary) hypertension: Secondary | ICD-10-CM | POA: Diagnosis not present

## 2015-03-30 DIAGNOSIS — Z6841 Body Mass Index (BMI) 40.0 and over, adult: Secondary | ICD-10-CM | POA: Diagnosis not present

## 2015-03-30 DIAGNOSIS — E7800 Pure hypercholesterolemia, unspecified: Secondary | ICD-10-CM

## 2015-03-30 DIAGNOSIS — N189 Chronic kidney disease, unspecified: Secondary | ICD-10-CM | POA: Diagnosis not present

## 2015-03-30 DIAGNOSIS — E1122 Type 2 diabetes mellitus with diabetic chronic kidney disease: Secondary | ICD-10-CM | POA: Diagnosis not present

## 2015-03-30 DIAGNOSIS — E1129 Type 2 diabetes mellitus with other diabetic kidney complication: Secondary | ICD-10-CM

## 2015-03-30 LAB — BAYER DCA HB A1C WAIVED: HB A1C: 7.1 % — AB (ref <7.0)

## 2015-03-30 NOTE — Assessment & Plan Note (Signed)
The current medical regimen is effective;  continue present plan and medications.  

## 2015-03-30 NOTE — Assessment & Plan Note (Signed)
Discussed diet and exercise and nutrition

## 2015-03-30 NOTE — Progress Notes (Signed)
   BP 124/72 mmHg  Pulse 73  Temp(Src) 97.8 F (36.6 C)  Ht 5\' 5"  (1.651 m)  Wt 251 lb (113.853 kg)  BMI 41.77 kg/m2  SpO2 96%   Subjective:    Patient ID: Melissa Singh, female    DOB: 1951-03-23, 64 y.o.   MRN: [id number]  HPI: Melissa Singh is a 64 y.o. female  Chief Complaint  Patient presents with  . Diabetes  Htn Lipids All ok No hypoglycemia Takes meds every day So side effects Did break wrist last week and limited diet   Relevant past medical, surgical, family and social history reviewed and updated as indicated. Interim medical history since our last visit reviewed. Allergies and medications reviewed and updated.  Review of Systems  Constitutional: Negative.   Respiratory: Negative.   Cardiovascular: Negative.   Musculoskeletal:       Wrist wound doing well stopped weeping     Per HPI unless specifically indicated above     Objective:    BP 124/72 mmHg  Pulse 73  Temp(Src) 97.8 F (36.6 C)  Ht 5\' 5"  (1.651 m)  Wt 251 lb (113.853 kg)  BMI 41.77 kg/m2  SpO2 96%  Wt Readings from Last 3 Encounters:  03/30/15 251 lb (113.853 kg)  12/29/14 251 lb (113.853 kg)  06/24/13 254 lb (115.214 kg)    Physical Exam  Constitutional: She is oriented to person, place, and time. She appears well-developed and well-nourished. No distress.  HENT:  Head: Normocephalic and atraumatic.  Right Ear: Hearing normal.  Left Ear: Hearing normal.  Nose: Nose normal.  Eyes: Conjunctivae and lids are normal. Right eye exhibits no discharge. Left eye exhibits no discharge. No scleral icterus.  Cardiovascular: Normal rate, regular rhythm and normal heart sounds.   Pulmonary/Chest: Effort normal and breath sounds normal. No respiratory distress.  Musculoskeletal: Normal range of motion.  Lt wrist wound healing well no sign of infection   Neurological: She is alert and oriented to person, place, and time.  Skin: Skin is intact. No rash noted.  Psychiatric: She has a normal  mood and affect. Her speech is normal and behavior is normal. Judgment and thought content normal. Cognition and memory are normal.        Assessment & Plan:   Problem List Items Addressed This Visit      Cardiovascular and Mediastinum   Essential hypertension    The current medical regimen is effective;  continue present plan and medications.         Endocrine   Diabetes mellitus - Primary   Relevant Orders   Bayer DCA Hb A1c Waived   Type II diabetes mellitus with renal manifestations    The current medical regimen is effective;  continue present plan and medications.       Relevant Orders   Bayer DCA Hb A1c Waived     Other   Body mass index 40.0-44.9, adult (Chronic)    Discussed diet and exercise and nutrition       Elevated cholesterol    The current medical regimen is effective;  continue present plan and medications.           Follow up plan: Return in about 3 months (around 06/30/2015) for Physical Exam labs and a1c.

## 2015-04-15 DIAGNOSIS — H25013 Cortical age-related cataract, bilateral: Secondary | ICD-10-CM | POA: Diagnosis not present

## 2015-04-20 DIAGNOSIS — S62102S Fracture of unspecified carpal bone, left wrist, sequela: Secondary | ICD-10-CM | POA: Diagnosis not present

## 2015-04-27 DIAGNOSIS — M25532 Pain in left wrist: Secondary | ICD-10-CM | POA: Diagnosis not present

## 2015-04-27 DIAGNOSIS — S52502D Unspecified fracture of the lower end of left radius, subsequent encounter for closed fracture with routine healing: Secondary | ICD-10-CM | POA: Diagnosis not present

## 2015-04-28 DIAGNOSIS — K862 Cyst of pancreas: Secondary | ICD-10-CM | POA: Diagnosis not present

## 2015-04-28 DIAGNOSIS — I10 Essential (primary) hypertension: Secondary | ICD-10-CM | POA: Diagnosis not present

## 2015-04-28 DIAGNOSIS — E119 Type 2 diabetes mellitus without complications: Secondary | ICD-10-CM | POA: Diagnosis not present

## 2015-04-28 DIAGNOSIS — I272 Other secondary pulmonary hypertension: Secondary | ICD-10-CM | POA: Diagnosis not present

## 2015-04-28 DIAGNOSIS — Z6841 Body Mass Index (BMI) 40.0 and over, adult: Secondary | ICD-10-CM | POA: Diagnosis not present

## 2015-04-28 DIAGNOSIS — Z794 Long term (current) use of insulin: Secondary | ICD-10-CM | POA: Diagnosis not present

## 2015-04-28 DIAGNOSIS — R938 Abnormal findings on diagnostic imaging of other specified body structures: Secondary | ICD-10-CM | POA: Diagnosis not present

## 2015-04-28 DIAGNOSIS — I471 Supraventricular tachycardia: Secondary | ICD-10-CM | POA: Diagnosis not present

## 2015-04-28 DIAGNOSIS — E785 Hyperlipidemia, unspecified: Secondary | ICD-10-CM | POA: Diagnosis not present

## 2015-04-28 DIAGNOSIS — Z87442 Personal history of urinary calculi: Secondary | ICD-10-CM | POA: Diagnosis not present

## 2015-04-28 DIAGNOSIS — J45909 Unspecified asthma, uncomplicated: Secondary | ICD-10-CM | POA: Diagnosis not present

## 2015-04-28 DIAGNOSIS — Z79899 Other long term (current) drug therapy: Secondary | ICD-10-CM | POA: Diagnosis not present

## 2015-04-29 DIAGNOSIS — S62102S Fracture of unspecified carpal bone, left wrist, sequela: Secondary | ICD-10-CM | POA: Diagnosis not present

## 2015-05-06 DIAGNOSIS — R609 Edema, unspecified: Secondary | ICD-10-CM | POA: Diagnosis not present

## 2015-05-06 DIAGNOSIS — M25532 Pain in left wrist: Secondary | ICD-10-CM | POA: Diagnosis not present

## 2015-05-13 DIAGNOSIS — R609 Edema, unspecified: Secondary | ICD-10-CM | POA: Diagnosis not present

## 2015-05-13 DIAGNOSIS — M25532 Pain in left wrist: Secondary | ICD-10-CM | POA: Diagnosis not present

## 2015-05-26 DIAGNOSIS — R609 Edema, unspecified: Secondary | ICD-10-CM | POA: Diagnosis not present

## 2015-05-26 DIAGNOSIS — M25532 Pain in left wrist: Secondary | ICD-10-CM | POA: Diagnosis not present

## 2015-06-01 DIAGNOSIS — M25532 Pain in left wrist: Secondary | ICD-10-CM | POA: Diagnosis not present

## 2015-06-01 DIAGNOSIS — R609 Edema, unspecified: Secondary | ICD-10-CM | POA: Diagnosis not present

## 2015-06-11 DIAGNOSIS — S62102S Fracture of unspecified carpal bone, left wrist, sequela: Secondary | ICD-10-CM | POA: Diagnosis not present

## 2015-06-15 DIAGNOSIS — S52502D Unspecified fracture of the lower end of left radius, subsequent encounter for closed fracture with routine healing: Secondary | ICD-10-CM | POA: Diagnosis not present

## 2015-06-15 DIAGNOSIS — M25532 Pain in left wrist: Secondary | ICD-10-CM | POA: Diagnosis not present

## 2015-06-18 DIAGNOSIS — S62102S Fracture of unspecified carpal bone, left wrist, sequela: Secondary | ICD-10-CM | POA: Diagnosis not present

## 2015-07-02 ENCOUNTER — Other Ambulatory Visit: Payer: Self-pay | Admitting: Family Medicine

## 2015-07-13 ENCOUNTER — Ambulatory Visit (INDEPENDENT_AMBULATORY_CARE_PROVIDER_SITE_OTHER): Payer: Medicare Other | Admitting: Family Medicine

## 2015-07-13 ENCOUNTER — Encounter: Payer: Self-pay | Admitting: Family Medicine

## 2015-07-13 VITALS — BP 134/83 | HR 64 | Temp 97.7°F | Ht 63.7 in | Wt 252.0 lb

## 2015-07-13 DIAGNOSIS — Z794 Long term (current) use of insulin: Secondary | ICD-10-CM | POA: Diagnosis not present

## 2015-07-13 DIAGNOSIS — I10 Essential (primary) hypertension: Secondary | ICD-10-CM | POA: Diagnosis not present

## 2015-07-13 DIAGNOSIS — K863 Pseudocyst of pancreas: Secondary | ICD-10-CM | POA: Diagnosis not present

## 2015-07-13 DIAGNOSIS — E7800 Pure hypercholesterolemia, unspecified: Secondary | ICD-10-CM

## 2015-07-13 DIAGNOSIS — Z0000 Encounter for general adult medical examination without abnormal findings: Secondary | ICD-10-CM | POA: Diagnosis not present

## 2015-07-13 DIAGNOSIS — Z23 Encounter for immunization: Secondary | ICD-10-CM

## 2015-07-13 DIAGNOSIS — E1122 Type 2 diabetes mellitus with diabetic chronic kidney disease: Secondary | ICD-10-CM

## 2015-07-13 DIAGNOSIS — Z6841 Body Mass Index (BMI) 40.0 and over, adult: Secondary | ICD-10-CM

## 2015-07-13 DIAGNOSIS — J45909 Unspecified asthma, uncomplicated: Secondary | ICD-10-CM | POA: Insufficient documentation

## 2015-07-13 DIAGNOSIS — J4520 Mild intermittent asthma, uncomplicated: Secondary | ICD-10-CM | POA: Diagnosis not present

## 2015-07-13 DIAGNOSIS — N182 Chronic kidney disease, stage 2 (mild): Secondary | ICD-10-CM

## 2015-07-13 LAB — URINALYSIS, ROUTINE W REFLEX MICROSCOPIC
Bilirubin, UA: NEGATIVE
KETONES UA: NEGATIVE
NITRITE UA: NEGATIVE
PROTEIN UA: NEGATIVE
RBC, UA: NEGATIVE
SPEC GRAV UA: 1.015 (ref 1.005–1.030)
UUROB: 0.2 mg/dL (ref 0.2–1.0)
pH, UA: 5 (ref 5.0–7.5)

## 2015-07-13 LAB — MICROSCOPIC EXAMINATION
Epithelial Cells (non renal): >10 /hpf — AB (ref 0–10)
Renal Epithel, UA: NONE SEEN /hpf

## 2015-07-13 LAB — BAYER DCA HB A1C WAIVED: HB A1C: 6.9 % (ref <7.0)

## 2015-07-13 MED ORDER — PIOGLITAZONE HCL 45 MG PO TABS: 45.0000 mg | ORAL_TABLET | Freq: Every day | ORAL | Status: DC

## 2015-07-13 MED ORDER — INSULIN ASPART 100 UNIT/ML FLEXPEN: 35.0000 [IU] | PEN_INJECTOR | Freq: Three times a day (TID) | SUBCUTANEOUS | Status: DC

## 2015-07-13 MED ORDER — INSULIN GLARGINE 100 UNIT/ML SOLOSTAR PEN: 40.0000 [IU] | PEN_INJECTOR | Freq: Every day | SUBCUTANEOUS | Status: DC

## 2015-07-13 MED ORDER — LIRAGLUTIDE 18 MG/3ML ~~LOC~~ SOPN: 1.2000 mL | PEN_INJECTOR | Freq: Every day | SUBCUTANEOUS | Status: DC

## 2015-07-13 MED ORDER — LOSARTAN POTASSIUM 100 MG PO TABS: 100.0000 mg | ORAL_TABLET | Freq: Every day | ORAL | Status: DC

## 2015-07-13 MED ORDER — GLUCOSE BLOOD VI STRP: ORAL_STRIP | Status: DC

## 2015-07-13 MED ORDER — DILTIAZEM HCL ER 240 MG PO CP24: 240.0000 mg | ORAL_CAPSULE | Freq: Every day | ORAL | Status: DC

## 2015-07-13 MED ORDER — COLESEVELAM HCL 625 MG PO TABS: 1875.0000 mg | ORAL_TABLET | Freq: Two times a day (BID) | ORAL | Status: DC

## 2015-07-13 MED ORDER — DAPAGLIFLOZIN PROPANEDIOL 5 MG PO TABS: 5.0000 mg | ORAL_TABLET | Freq: Every day | ORAL | Status: DC

## 2015-07-13 NOTE — Assessment & Plan Note (Signed)
The current medical regimen is effective;  continue present plan and medications.  

## 2015-07-13 NOTE — Assessment & Plan Note (Signed)
Discuss wt loss exercise

## 2015-07-13 NOTE — Progress Notes (Signed)
BP 134/83 mmHg  Pulse 64  Temp(Src) 97.7 F (36.5 C)  Ht 5' 3.7" (1.618 m)  Wt 252 lb (114.306 kg)  BMI 43.66 kg/m2  SpO2 96%   Subjective:    Patient ID: Melissa Singh, female    DOB: 01-21-1951, 64 y.o.   MRN: [id number]  HPI: Melissa Singh is a 64 y.o. female  Chief Complaint  Patient presents with  . Annual Exam  AWV METRICS MET  Patient also for follow-up of medical illnesses  Hypertension good control no side effects from medication Hypercholesterol no side effects no myalgias taking medicines faithfully without side effects Diabetes good control no low blood sugar spells using insulin weight has remained stable. Patient had left wrist surgery limiting her is of her hand active this summer which is done well surgery was done because of fracture. From a fall. Patient fell over her sister's dog. Pulled her down. Relevant past medical, surgical, family and social history reviewed and updated as indicated. Interim medical history since our last visit reviewed. Allergies and medications reviewed and updated.  Review of Systems  Constitutional: Negative.   HENT: Negative.   Eyes: Negative.   Respiratory: Negative.   Cardiovascular: Negative.   Gastrointestinal: Negative.   Endocrine: Negative.   Genitourinary: Negative.   Musculoskeletal: Negative.   Skin: Negative.   Allergic/Immunologic: Negative.   Neurological: Negative.   Hematological: Negative.   Psychiatric/Behavioral: Negative.     Per HPI unless specifically indicated above     Objective:    BP 134/83 mmHg  Pulse 64  Temp(Src) 97.7 F (36.5 C)  Ht 5' 3.7" (1.618 m)  Wt 252 lb (114.306 kg)  BMI 43.66 kg/m2  SpO2 96%  Wt Readings from Last 3 Encounters:  07/13/15 252 lb (114.306 kg)  03/30/15 251 lb (113.853 kg)  12/29/14 251 lb (113.853 kg)    Physical Exam  Constitutional: She is oriented to person, place, and time. She appears well-developed and well-nourished.  HENT:  Head:  Normocephalic and atraumatic.  Right Ear: External ear normal.  Left Ear: External ear normal.  Nose: Nose normal.  Mouth/Throat: Oropharynx is clear and moist.  Eyes: Conjunctivae and EOM are normal. Pupils are equal, round, and reactive to light.  Neck: Normal range of motion. Neck supple. Carotid bruit is not present.  Cardiovascular: Normal rate, regular rhythm and normal heart sounds.   No murmur heard. Pulmonary/Chest: Effort normal and breath sounds normal.  Abdominal: Soft. Bowel sounds are normal. There is no hepatosplenomegaly.  Musculoskeletal: Normal range of motion.  Neurological: She is alert and oriented to person, place, and time.  Skin: No rash noted.  Psychiatric: She has a normal mood and affect. Her behavior is normal. Judgment and thought content normal.    Results for orders placed or performed in visit on 03/30/15  Bayer DCA Hb A1c Waived  Result Value Ref Range   Bayer DCA Hb A1c Waived 7.1 (H) <7.0 %      Assessment & Plan:   Problem List Items Addressed This Visit      Cardiovascular and Mediastinum   Essential hypertension    The current medical regimen is effective;  continue present plan and medications.       Relevant Medications   losartan (COZAAR) 100 MG tablet   diltiazem (DILACOR XR) 240 MG 24 hr capsule   colesevelam (WELCHOL) 625 MG tablet   Other Relevant Orders   Comprehensive metabolic panel   Lipid panel   CBC with  Differential/Platelet   TSH   Urinalysis, Routine w reflex microscopic (not at [facility])     Respiratory   Asthma    Followed by pulmonary and stable        Endocrine   Type II diabetes mellitus with renal manifestations ([place])    The current medical regimen is effective;  continue present plan and medications.       Relevant Medications   pioglitazone (ACTOS) 45 MG tablet   losartan (COZAAR) 100 MG tablet   Liraglutide (VICTOZA) 18 MG/3ML SOPN   dapagliflozin propanediol (FARXIGA) 5 MG TABS tablet   insulin  aspart (NOVOLOG FLEXPEN) 100 UNIT/ML FlexPen   Insulin Glargine (LANTUS SOLOSTAR) 100 UNIT/ML Solostar Pen   Other Relevant Orders   Bayer DCA Hb A1c Waived   Lipid panel   CBC with Differential/Platelet   TSH   Urinalysis, Routine w reflex microscopic (not at [facility])     Other   Body mass index 40.0-44.9, adult (HCC) (Chronic)    Discuss wt loss exercise      Relevant Medications   pioglitazone (ACTOS) 45 MG tablet   Liraglutide (VICTOZA) 18 MG/3ML SOPN   dapagliflozin propanediol (FARXIGA) 5 MG TABS tablet   insulin aspart (NOVOLOG FLEXPEN) 100 UNIT/ML FlexPen   Insulin Glargine (LANTUS SOLOSTAR) 100 UNIT/ML Solostar Pen   Other Relevant Orders   TSH   Urinalysis, Routine w reflex microscopic (not at [facility])   Hypercholesteremia    The current medical regimen is effective;  continue present plan and medications.       Relevant Medications   losartan (COZAAR) 100 MG tablet   diltiazem (DILACOR XR) 240 MG 24 hr capsule   colesevelam (WELCHOL) 625 MG tablet   Other Relevant Orders   Lipid panel   Pancreatic pseudocyst    Followed at [facility] had biopsy this summer.       Other Visit Diagnoses    Immunization due    -  Primary    Relevant Orders    Flu Vaccine QUAD 36+ mos PF IM (Fluarix & Fluzone Quad PF) (Completed)    PE (physical exam), annual            Follow up plan: Return in about 3 months (around 10/13/2015), or if symptoms worsen or fail to improve, for a1c.

## 2015-07-13 NOTE — Assessment & Plan Note (Signed)
Followed at [facility] had biopsy this summer.

## 2015-07-13 NOTE — Assessment & Plan Note (Signed)
Followed by pulmonary and stable 

## 2015-07-14 ENCOUNTER — Encounter: Payer: Self-pay | Admitting: Family Medicine

## 2015-07-14 ENCOUNTER — Other Ambulatory Visit: Payer: Self-pay | Admitting: Family Medicine

## 2015-07-14 DIAGNOSIS — E1122 Type 2 diabetes mellitus with diabetic chronic kidney disease: Secondary | ICD-10-CM

## 2015-07-14 DIAGNOSIS — Z794 Long term (current) use of insulin: Principal | ICD-10-CM

## 2015-07-14 DIAGNOSIS — N182 Chronic kidney disease, stage 2 (mild): Principal | ICD-10-CM

## 2015-07-14 LAB — CBC WITH DIFFERENTIAL/PLATELET
BASOS ABS: 0 10*3/uL (ref 0.0–0.2)
BASOS: 1 %
EOS (ABSOLUTE): 0.5 10*3/uL — ABNORMAL HIGH (ref 0.0–0.4)
EOS: 6 %
HEMATOCRIT: 40.9 % (ref 34.0–46.6)
HEMOGLOBIN: 13.5 g/dL (ref 11.1–15.9)
IMMATURE GRANS (ABS): 0 10*3/uL (ref 0.0–0.1)
Immature Granulocytes: 0 %
LYMPHS ABS: 1.7 10*3/uL (ref 0.7–3.1)
LYMPHS: 22 %
MCH: 26.7 pg (ref 26.6–33.0)
MCHC: 33 g/dL (ref 31.5–35.7)
MCV: 81 fL (ref 79–97)
MONOCYTES: 6 %
Monocytes Absolute: 0.5 10*3/uL (ref 0.1–0.9)
NEUTROS ABS: 5.2 10*3/uL (ref 1.4–7.0)
Neutrophils: 65 %
Platelets: 313 10*3/uL (ref 150–379)
RBC: 5.06 x10E6/uL (ref 3.77–5.28)
RDW: 16.3 % — ABNORMAL HIGH (ref 12.3–15.4)
WBC: 8 10*3/uL (ref 3.4–10.8)

## 2015-07-14 LAB — COMPREHENSIVE METABOLIC PANEL
A/G RATIO: 1.2 (ref 1.1–2.5)
ALK PHOS: 112 IU/L (ref 39–117)
ALT: 15 IU/L (ref 0–32)
AST: 12 IU/L (ref 0–40)
Albumin: 3.5 g/dL — ABNORMAL LOW (ref 3.6–4.8)
BILIRUBIN TOTAL: 0.4 mg/dL (ref 0.0–1.2)
BUN/Creatinine Ratio: 21 (ref 11–26)
BUN: 24 mg/dL (ref 8–27)
CALCIUM: 10 mg/dL (ref 8.7–10.3)
CHLORIDE: 105 mmol/L (ref 97–108)
CO2: 21 mmol/L (ref 18–29)
Creatinine, Ser: 1.16 mg/dL — ABNORMAL HIGH (ref 0.57–1.00)
GFR calc Af Amer: 57 mL/min/{1.73_m2} — ABNORMAL LOW (ref >59)
GFR, EST NON AFRICAN AMERICAN: 49 mL/min/{1.73_m2} — AB (ref >59)
GLOBULIN, TOTAL: 3 g/dL (ref 1.5–4.5)
Glucose: 115 mg/dL — ABNORMAL HIGH (ref 65–99)
POTASSIUM: 4.9 mmol/L (ref 3.5–5.2)
SODIUM: 140 mmol/L (ref 134–144)
Total Protein: 6.5 g/dL (ref 6.0–8.5)

## 2015-07-14 LAB — LIPID PANEL
CHOL/HDL RATIO: 3.2 ratio (ref 0.0–4.4)
CHOLESTEROL TOTAL: 161 mg/dL (ref 100–199)
HDL: 51 mg/dL (ref >39)
LDL Calculated: 66 mg/dL (ref 0–99)
TRIGLYCERIDES: 221 mg/dL — AB (ref 0–149)
VLDL Cholesterol Cal: 44 mg/dL — ABNORMAL HIGH (ref 5–40)

## 2015-07-14 LAB — TSH: TSH: 3.42 u[IU]/mL (ref 0.450–4.500)

## 2015-07-14 MED ORDER — LIRAGLUTIDE 18 MG/3ML ~~LOC~~ SOPN: 1.2000 mg | PEN_INJECTOR | Freq: Every day | SUBCUTANEOUS | Status: DC

## 2015-08-10 DIAGNOSIS — J4520 Mild intermittent asthma, uncomplicated: Secondary | ICD-10-CM | POA: Diagnosis not present

## 2015-08-10 DIAGNOSIS — I340 Nonrheumatic mitral (valve) insufficiency: Secondary | ICD-10-CM | POA: Diagnosis not present

## 2015-08-10 DIAGNOSIS — I10 Essential (primary) hypertension: Secondary | ICD-10-CM | POA: Diagnosis not present

## 2015-08-10 DIAGNOSIS — R0602 Shortness of breath: Secondary | ICD-10-CM | POA: Diagnosis not present

## 2015-08-10 DIAGNOSIS — R011 Cardiac murmur, unspecified: Secondary | ICD-10-CM | POA: Diagnosis not present

## 2015-08-10 DIAGNOSIS — J310 Chronic rhinitis: Secondary | ICD-10-CM | POA: Diagnosis not present

## 2015-08-10 DIAGNOSIS — K219 Gastro-esophageal reflux disease without esophagitis: Secondary | ICD-10-CM | POA: Diagnosis not present

## 2015-08-10 DIAGNOSIS — I499 Cardiac arrhythmia, unspecified: Secondary | ICD-10-CM | POA: Diagnosis not present

## 2015-08-10 DIAGNOSIS — E119 Type 2 diabetes mellitus without complications: Secondary | ICD-10-CM | POA: Diagnosis not present

## 2015-08-10 DIAGNOSIS — E6609 Other obesity due to excess calories: Secondary | ICD-10-CM | POA: Diagnosis not present

## 2015-08-10 DIAGNOSIS — E782 Mixed hyperlipidemia: Secondary | ICD-10-CM | POA: Diagnosis not present

## 2015-09-01 DIAGNOSIS — Z23 Encounter for immunization: Secondary | ICD-10-CM | POA: Diagnosis not present

## 2015-11-02 ENCOUNTER — Encounter: Payer: Self-pay | Admitting: Family Medicine

## 2015-11-02 ENCOUNTER — Ambulatory Visit (INDEPENDENT_AMBULATORY_CARE_PROVIDER_SITE_OTHER): Payer: Medicare Other | Admitting: Family Medicine

## 2015-11-02 VITALS — BP 141/84 | HR 76 | Temp 97.6°F | Ht 64.0 in | Wt 256.0 lb

## 2015-11-02 DIAGNOSIS — I10 Essential (primary) hypertension: Secondary | ICD-10-CM

## 2015-11-02 DIAGNOSIS — N182 Chronic kidney disease, stage 2 (mild): Secondary | ICD-10-CM

## 2015-11-02 DIAGNOSIS — Z794 Long term (current) use of insulin: Secondary | ICD-10-CM | POA: Diagnosis not present

## 2015-11-02 DIAGNOSIS — E7800 Pure hypercholesterolemia, unspecified: Secondary | ICD-10-CM | POA: Diagnosis not present

## 2015-11-02 DIAGNOSIS — E1122 Type 2 diabetes mellitus with diabetic chronic kidney disease: Secondary | ICD-10-CM

## 2015-11-02 DIAGNOSIS — E119 Type 2 diabetes mellitus without complications: Secondary | ICD-10-CM

## 2015-11-02 LAB — BAYER DCA HB A1C WAIVED: HB A1C: 7 % — AB (ref <7.0)

## 2015-11-02 MED ORDER — ATORVASTATIN CALCIUM 20 MG PO TABS: 20.0000 mg | ORAL_TABLET | Freq: Every day | ORAL | Status: DC

## 2015-11-02 MED ORDER — ATENOLOL 100 MG PO TABS: 100.0000 mg | ORAL_TABLET | Freq: Every day | ORAL | Status: DC

## 2015-11-02 NOTE — Assessment & Plan Note (Signed)
The current medical regimen is effective;  continue present plan and medications.  

## 2015-11-02 NOTE — Progress Notes (Signed)
BP 141/84 mmHg  Pulse 76  Temp(Src) 97.6 F (36.4 C)  Ht 5\' 4"  (1.626 m)  Wt 256 lb (116.121 kg)  BMI 43.92 kg/m2   Subjective:    Patient ID: Melissa Singh, female    DOB: 1951-05-24, 65 y.o.   MRN: [id number]  HPI: Melissa Singh is a 65 y.o. female  Chief Complaint  Patient presents with  . Diabetes   patient follow-up diabetes doing well with medications no side effects noted low blood sugar spells taking medications faithfully Patient's left wrist surgery from fracture doing well except for tenderness along the surgical incision site Discuss use of Zostrix cream  Cholesterol doing well with medications no problems Blood pressure doing well with no problems  Relevant past medical, surgical, family and social history reviewed and updated as indicated. Interim medical history since our last visit reviewed. Allergies and medications reviewed and updated.  Review of Systems  Constitutional: Negative.   Respiratory: Negative.   Cardiovascular: Negative.     Per HPI unless specifically indicated above     Objective:    BP 141/84 mmHg  Pulse 76  Temp(Src) 97.6 F (36.4 C)  Ht 5\' 4"  (1.626 m)  Wt 256 lb (116.121 kg)  BMI 43.92 kg/m2  Wt Readings from Last 3 Encounters:  11/02/15 256 lb (116.121 kg)  07/13/15 252 lb (114.306 kg)  03/30/15 251 lb (113.853 kg)    Physical Exam  Cardiovascular: Normal rate, regular rhythm and normal heart sounds.   Pulmonary/Chest: Breath sounds normal.    Results for orders placed or performed in visit on 07/13/15  Microscopic Examination  Result Value Ref Range   WBC, UA 6-10 (A) 0 -  5 /hpf   RBC, UA 0-2 0 -  2 /hpf   Epithelial Cells (non renal) >10 (A) 0 - 10 /hpf   Renal Epithel, UA None seen None seen /hpf   Mucus, UA Present Not Estab.   Bacteria, UA Few None seen/Few   Yeast, UA Present (A) None seen  Bayer DCA Hb A1c Waived  Result Value Ref Range   Bayer DCA Hb A1c Waived 6.9 <7.0 %  Comprehensive metabolic  panel  Result Value Ref Range   Glucose 115 (H) 65 - 99 mg/dL   BUN 24 8 - 27 mg/dL   Creatinine, Ser 1.16 (H) 0.57 - 1.00 mg/dL   GFR calc non Af Amer 49 (L) >59 mL/min/1.73   GFR calc Af Amer 57 (L) >59 mL/min/1.73   BUN/Creatinine Ratio 21 11 - 26   Sodium 140 134 - 144 mmol/L   Potassium 4.9 3.5 - 5.2 mmol/L   Chloride 105 97 - 108 mmol/L   CO2 21 18 - 29 mmol/L   Calcium 10.0 8.7 - 10.3 mg/dL   Total Protein 6.5 6.0 - 8.5 g/dL   Albumin 3.5 (L) 3.6 - 4.8 g/dL   Globulin, Total 3.0 1.5 - 4.5 g/dL   Albumin/Globulin Ratio 1.2 1.1 - 2.5   Bilirubin Total 0.4 0.0 - 1.2 mg/dL   Alkaline Phosphatase 112 39 - 117 IU/L   AST 12 0 - 40 IU/L   ALT 15 0 - 32 IU/L  Lipid panel  Result Value Ref Range   Cholesterol, Total 161 100 - 199 mg/dL   Triglycerides 221 (H) 0 - 149 mg/dL   HDL 51 >39 mg/dL   VLDL Cholesterol Cal 44 (H) 5 - 40 mg/dL   LDL Calculated 66 0 - 99 mg/dL   Chol/HDL  Ratio 3.2 0.0 - 4.4 ratio units  CBC with Differential/Platelet  Result Value Ref Range   WBC 8.0 3.4 - 10.8 x10E3/uL   RBC 5.06 3.77 - 5.28 x10E6/uL   Hemoglobin 13.5 11.1 - 15.9 g/dL   Hematocrit 40.9 34.0 - 46.6 %   MCV 81 79 - 97 fL   MCH 26.7 26.6 - 33.0 pg   MCHC 33.0 31.5 - 35.7 g/dL   RDW 16.3 (H) 12.3 - 15.4 %   Platelets 313 150 - 379 x10E3/uL   Neutrophils 65 %   Lymphs 22 %   Monocytes 6 %   Eos 6 %   Basos 1 %   Neutrophils Absolute 5.2 1.4 - 7.0 x10E3/uL   Lymphocytes Absolute 1.7 0.7 - 3.1 x10E3/uL   Monocytes Absolute 0.5 0.1 - 0.9 x10E3/uL   EOS (ABSOLUTE) 0.5 (H) 0.0 - 0.4 x10E3/uL   Basophils Absolute 0.0 0.0 - 0.2 x10E3/uL   Immature Granulocytes 0 %   Immature Grans (Abs) 0.0 0.0 - 0.1 x10E3/uL  TSH  Result Value Ref Range   TSH 3.420 0.450 - 4.500 uIU/mL  Urinalysis, Routine w reflex microscopic (not at [facility])  Result Value Ref Range   Specific Gravity, UA 1.015 1.005 - 1.030   pH, UA 5.0 5.0 - 7.5   Color, UA Yellow Yellow   Appearance Ur Cloudy (A) Clear    Leukocytes, UA Trace (A) Negative   Protein, UA Negative Negative/Trace   Glucose, UA 3+ (A) Negative   Ketones, UA Negative Negative   RBC, UA Negative Negative   Bilirubin, UA Negative Negative   Urobilinogen, Ur 0.2 0.2 - 1.0 mg/dL   Nitrite, UA Negative Negative   Microscopic Examination See below:       Assessment & Plan:   Problem List Items Addressed This Visit      Cardiovascular and Mediastinum   Essential hypertension    The current medical regimen is effective;  continue present plan and medications.       Relevant Medications   atenolol (TENORMIN) 100 MG tablet   atorvastatin (LIPITOR) 20 MG tablet     Endocrine   Type II diabetes mellitus with renal manifestations ([place])    The current medical regimen is effective;  continue present plan and medications.       Relevant Medications   atorvastatin (LIPITOR) 20 MG tablet     Other   Hypercholesteremia    The current medical regimen is effective;  continue present plan and medications.       Relevant Medications   atenolol (TENORMIN) 100 MG tablet   atorvastatin (LIPITOR) 20 MG tablet    Other Visit Diagnoses    Diabetes mellitus without complication ([place])    -  Primary    Relevant Medications    atorvastatin (LIPITOR) 20 MG tablet    Other Relevant Orders    Bayer DCA Hb A1c Waived        Follow up plan: Return in about 3 months (around 01/31/2016), or if symptoms worsen or fail to improve, for Hemoglobin A1c, BMP, lipid panel, ALT, AST.

## 2015-12-14 DIAGNOSIS — S52502D Unspecified fracture of the lower end of left radius, subsequent encounter for closed fracture with routine healing: Secondary | ICD-10-CM | POA: Diagnosis not present

## 2015-12-14 DIAGNOSIS — M25532 Pain in left wrist: Secondary | ICD-10-CM | POA: Diagnosis not present

## 2015-12-14 DIAGNOSIS — Y33XXXD Other specified events, undetermined intent, subsequent encounter: Secondary | ICD-10-CM | POA: Diagnosis not present

## 2015-12-14 DIAGNOSIS — S52502A Unspecified fracture of the lower end of left radius, initial encounter for closed fracture: Secondary | ICD-10-CM | POA: Diagnosis not present

## 2015-12-14 DIAGNOSIS — M1812 Unilateral primary osteoarthritis of first carpometacarpal joint, left hand: Secondary | ICD-10-CM | POA: Diagnosis not present

## 2016-01-03 DIAGNOSIS — S8992XA Unspecified injury of left lower leg, initial encounter: Secondary | ICD-10-CM | POA: Diagnosis not present

## 2016-01-03 DIAGNOSIS — S8392XA Sprain of unspecified site of left knee, initial encounter: Secondary | ICD-10-CM | POA: Diagnosis not present

## 2016-01-22 DIAGNOSIS — M1712 Unilateral primary osteoarthritis, left knee: Secondary | ICD-10-CM | POA: Diagnosis not present

## 2016-01-22 DIAGNOSIS — M25562 Pain in left knee: Secondary | ICD-10-CM | POA: Diagnosis not present

## 2016-02-03 ENCOUNTER — Encounter: Payer: Self-pay | Admitting: Family Medicine

## 2016-02-03 ENCOUNTER — Ambulatory Visit (INDEPENDENT_AMBULATORY_CARE_PROVIDER_SITE_OTHER): Payer: Medicare Other | Admitting: Family Medicine

## 2016-02-03 VITALS — BP 133/84 | HR 68 | Temp 97.7°F | Ht 63.6 in | Wt 255.0 lb

## 2016-02-03 DIAGNOSIS — Z0000 Encounter for general adult medical examination without abnormal findings: Secondary | ICD-10-CM

## 2016-02-03 DIAGNOSIS — E7800 Pure hypercholesterolemia, unspecified: Secondary | ICD-10-CM

## 2016-02-03 DIAGNOSIS — I10 Essential (primary) hypertension: Secondary | ICD-10-CM

## 2016-02-03 DIAGNOSIS — J4520 Mild intermittent asthma, uncomplicated: Secondary | ICD-10-CM

## 2016-02-03 DIAGNOSIS — Z6841 Body Mass Index (BMI) 40.0 and over, adult: Secondary | ICD-10-CM | POA: Diagnosis not present

## 2016-02-03 DIAGNOSIS — Z794 Long term (current) use of insulin: Secondary | ICD-10-CM | POA: Diagnosis not present

## 2016-02-03 DIAGNOSIS — N182 Chronic kidney disease, stage 2 (mild): Secondary | ICD-10-CM

## 2016-02-03 DIAGNOSIS — E1122 Type 2 diabetes mellitus with diabetic chronic kidney disease: Secondary | ICD-10-CM | POA: Diagnosis not present

## 2016-02-03 LAB — LP+ALT+AST PICCOLO, WAIVED
ALT (SGPT) PICCOLO, WAIVED: 30 U/L (ref 10–47)
AST (SGOT) Piccolo, Waived: 21 U/L (ref 11–38)
Chol/HDL Ratio Piccolo,Waive: 2.9 mg/dL
Cholesterol Piccolo, Waived: 168 mg/dL (ref <200)
HDL CHOL PICCOLO, WAIVED: 57 mg/dL — AB (ref >59)
LDL CHOL CALC PICCOLO WAIVED: 73 mg/dL (ref <100)
Triglycerides Piccolo,Waived: 192 mg/dL — ABNORMAL HIGH (ref <150)
VLDL CHOL CALC PICCOLO,WAIVE: 38 mg/dL — AB (ref <30)

## 2016-02-03 LAB — BAYER DCA HB A1C WAIVED: HB A1C: 7.9 % — AB (ref <7.0)

## 2016-02-03 MED ORDER — ATORVASTATIN CALCIUM 20 MG PO TABS: 20.0000 mg | ORAL_TABLET | Freq: Every day | ORAL | Status: DC

## 2016-02-03 MED ORDER — ATENOLOL 100 MG PO TABS: 100.0000 mg | ORAL_TABLET | Freq: Every day | ORAL | Status: DC

## 2016-02-03 NOTE — Assessment & Plan Note (Signed)
The current medical regimen is effective;  continue present plan and medications.  

## 2016-02-03 NOTE — Progress Notes (Signed)
BP 133/84 mmHg  Pulse 68  Temp(Src) 97.7 F (36.5 C)  Ht 5' 3.6" (1.615 m)  Wt 255 lb (115.667 kg)  BMI 44.35 kg/m2  SpO2 99%   Subjective:    Patient ID: Melissa Singh, female    DOB: 07-08-1951, 65 y.o.   MRN: [id number]  HPI: Melissa Singh is a 65 y.o. female  Chief Complaint  Patient presents with  . Diabetes  . Hypertension  . Hyperlipidemia  Patient follow-up diabetes doing well noted low blood sugar spells. Has had a lot of stress over the last several weeks turning her knee. Got a steroid shot. Patient all in all is doing okay. Blood pressure cholesterol taking medications faithfully without issues or side effects. Weight is remaining stable. Breathing as well as doing okay  Relevant past medical, surgical, family and social history reviewed and updated as indicated. Interim medical history since our last visit reviewed. Allergies and medications reviewed and updated.  Review of Systems  Constitutional: Negative.   Respiratory: Negative.   Cardiovascular: Negative.     Per HPI unless specifically indicated above     Objective:    BP 133/84 mmHg  Pulse 68  Temp(Src) 97.7 F (36.5 C)  Ht 5' 3.6" (1.615 m)  Wt 255 lb (115.667 kg)  BMI 44.35 kg/m2  SpO2 99%  Wt Readings from Last 3 Encounters:  02/03/16 255 lb (115.667 kg)  11/02/15 256 lb (116.121 kg)  07/13/15 252 lb (114.306 kg)    Physical Exam  Constitutional: She is oriented to person, place, and time. She appears well-developed and well-nourished. No distress.  HENT:  Head: Normocephalic and atraumatic.  Right Ear: Hearing normal.  Left Ear: Hearing normal.  Nose: Nose normal.  Eyes: Conjunctivae and lids are normal. Right eye exhibits no discharge. Left eye exhibits no discharge. No scleral icterus.  Cardiovascular: Normal rate, regular rhythm and normal heart sounds.   Pulmonary/Chest: Effort normal and breath sounds normal. No respiratory distress.  Musculoskeletal: Normal range of  motion.  Neurological: She is alert and oriented to person, place, and time.  Skin: Skin is intact. No rash noted.  Psychiatric: She has a normal mood and affect. Her speech is normal and behavior is normal. Judgment and thought content normal. Cognition and memory are normal.    Results for orders placed or performed in visit on 11/02/15  Bayer DCA Hb A1c Waived  Result Value Ref Range   Bayer DCA Hb A1c Waived 7.0 (H) <7.0 %      Assessment & Plan:   Problem List Items Addressed This Visit      Cardiovascular and Mediastinum   Essential hypertension    The current medical regimen is effective;  continue present plan and medications.       Relevant Medications   atenolol (TENORMIN) 100 MG tablet   atorvastatin (LIPITOR) 20 MG tablet   Other Relevant Orders   Bayer DCA Hb A1c Waived   LP+ALT+AST Piccolo, Waived   Basic metabolic panel     Respiratory   Asthma    The current medical regimen is effective;  continue present plan and medications.         Endocrine   Type II diabetes mellitus with renal manifestations ([place]) - Primary    Discuss poor control of diabetes adjusting insulin appropriately gave new glucometer and supplies. Discussed increase Victoza to 1.8 daily      Relevant Medications   atorvastatin (LIPITOR) 20 MG tablet   Other Relevant  Orders   Bayer DCA Hb A1c Waived   LP+ALT+AST Piccolo, Waived   Basic metabolic panel     Other   Body mass index 40.0-44.9, adult (HCC) (Chronic)    Discuss weight loss diet      Hypercholesteremia    The current medical regimen is effective;  continue present plan and medications.       Relevant Medications   atenolol (TENORMIN) 100 MG tablet   atorvastatin (LIPITOR) 20 MG tablet   Other Relevant Orders   Bayer DCA Hb A1c Waived   LP+ALT+AST Piccolo, Waived   Basic metabolic panel    Other Visit Diagnoses    Healthcare maintenance        Relevant Orders    Hepatitis C Antibody        Follow up  plan: Return in about 3 months (around 05/05/2016), or if symptoms worsen or fail to improve, for a1c.

## 2016-02-03 NOTE — Assessment & Plan Note (Signed)
Discuss weight loss diet

## 2016-02-03 NOTE — Assessment & Plan Note (Signed)
Discuss poor control of diabetes adjusting insulin appropriately gave new glucometer and supplies. Discussed increase Victoza to 1.8 daily

## 2016-02-04 ENCOUNTER — Encounter: Payer: Self-pay | Admitting: Family Medicine

## 2016-02-04 LAB — BASIC METABOLIC PANEL
BUN / CREAT RATIO: 26 (ref 12–28)
BUN: 33 mg/dL — AB (ref 8–27)
CHLORIDE: 104 mmol/L (ref 96–106)
CO2: 22 mmol/L (ref 18–29)
Calcium: 10.2 mg/dL (ref 8.7–10.3)
Creatinine, Ser: 1.25 mg/dL — ABNORMAL HIGH (ref 0.57–1.00)
GFR calc non Af Amer: 45 mL/min/{1.73_m2} — ABNORMAL LOW (ref >59)
GFR, EST AFRICAN AMERICAN: 52 mL/min/{1.73_m2} — AB (ref >59)
GLUCOSE: 116 mg/dL — AB (ref 65–99)
Potassium: 5.1 mmol/L (ref 3.5–5.2)
SODIUM: 140 mmol/L (ref 134–144)

## 2016-02-04 LAB — HEPATITIS C ANTIBODY

## 2016-02-15 DIAGNOSIS — R011 Cardiac murmur, unspecified: Secondary | ICD-10-CM | POA: Diagnosis not present

## 2016-02-15 DIAGNOSIS — I499 Cardiac arrhythmia, unspecified: Secondary | ICD-10-CM | POA: Diagnosis not present

## 2016-02-15 DIAGNOSIS — E6609 Other obesity due to excess calories: Secondary | ICD-10-CM | POA: Diagnosis not present

## 2016-02-15 DIAGNOSIS — E119 Type 2 diabetes mellitus without complications: Secondary | ICD-10-CM | POA: Diagnosis not present

## 2016-02-15 DIAGNOSIS — I340 Nonrheumatic mitral (valve) insufficiency: Secondary | ICD-10-CM | POA: Diagnosis not present

## 2016-02-15 DIAGNOSIS — I10 Essential (primary) hypertension: Secondary | ICD-10-CM | POA: Diagnosis not present

## 2016-02-15 DIAGNOSIS — E782 Mixed hyperlipidemia: Secondary | ICD-10-CM | POA: Diagnosis not present

## 2016-02-15 DIAGNOSIS — K219 Gastro-esophageal reflux disease without esophagitis: Secondary | ICD-10-CM | POA: Diagnosis not present

## 2016-02-19 DIAGNOSIS — K862 Cyst of pancreas: Secondary | ICD-10-CM | POA: Diagnosis not present

## 2016-02-19 DIAGNOSIS — M25562 Pain in left knee: Secondary | ICD-10-CM | POA: Diagnosis not present

## 2016-02-19 DIAGNOSIS — Y33XXXA Other specified events, undetermined intent, initial encounter: Secondary | ICD-10-CM | POA: Diagnosis not present

## 2016-02-19 DIAGNOSIS — S82142A Displaced bicondylar fracture of left tibia, initial encounter for closed fracture: Secondary | ICD-10-CM | POA: Diagnosis not present

## 2016-02-25 DIAGNOSIS — M1712 Unilateral primary osteoarthritis, left knee: Secondary | ICD-10-CM | POA: Diagnosis not present

## 2016-02-25 DIAGNOSIS — S82142A Displaced bicondylar fracture of left tibia, initial encounter for closed fracture: Secondary | ICD-10-CM | POA: Diagnosis not present

## 2016-03-21 DIAGNOSIS — J4520 Mild intermittent asthma, uncomplicated: Secondary | ICD-10-CM | POA: Diagnosis not present

## 2016-03-24 DIAGNOSIS — M1712 Unilateral primary osteoarthritis, left knee: Secondary | ICD-10-CM | POA: Diagnosis not present

## 2016-03-24 DIAGNOSIS — S82146D Nondisplaced bicondylar fracture of unspecified tibia, subsequent encounter for closed fracture with routine healing: Secondary | ICD-10-CM | POA: Diagnosis not present

## 2016-03-24 DIAGNOSIS — Y33XXXD Other specified events, undetermined intent, subsequent encounter: Secondary | ICD-10-CM | POA: Diagnosis not present

## 2016-03-24 DIAGNOSIS — S82142A Displaced bicondylar fracture of left tibia, initial encounter for closed fracture: Secondary | ICD-10-CM | POA: Diagnosis not present

## 2016-04-22 DIAGNOSIS — H2513 Age-related nuclear cataract, bilateral: Secondary | ICD-10-CM | POA: Diagnosis not present

## 2016-04-22 LAB — HM DIABETES EYE EXAM

## 2016-05-06 DIAGNOSIS — R970 Elevated carcinoembryonic antigen [CEA]: Secondary | ICD-10-CM | POA: Diagnosis not present

## 2016-05-06 DIAGNOSIS — K862 Cyst of pancreas: Secondary | ICD-10-CM | POA: Diagnosis not present

## 2016-05-09 ENCOUNTER — Ambulatory Visit (INDEPENDENT_AMBULATORY_CARE_PROVIDER_SITE_OTHER): Payer: Medicare Other | Admitting: Family Medicine

## 2016-05-09 ENCOUNTER — Other Ambulatory Visit: Payer: Self-pay

## 2016-05-09 ENCOUNTER — Encounter: Payer: Self-pay | Admitting: Family Medicine

## 2016-05-09 VITALS — BP 138/81 | HR 71 | Temp 97.8°F | Ht 64.7 in | Wt 258.0 lb

## 2016-05-09 DIAGNOSIS — Z794 Long term (current) use of insulin: Secondary | ICD-10-CM

## 2016-05-09 DIAGNOSIS — I10 Essential (primary) hypertension: Secondary | ICD-10-CM | POA: Diagnosis not present

## 2016-05-09 DIAGNOSIS — E1122 Type 2 diabetes mellitus with diabetic chronic kidney disease: Secondary | ICD-10-CM | POA: Diagnosis not present

## 2016-05-09 DIAGNOSIS — N182 Chronic kidney disease, stage 2 (mild): Secondary | ICD-10-CM

## 2016-05-09 DIAGNOSIS — E7800 Pure hypercholesterolemia, unspecified: Secondary | ICD-10-CM | POA: Diagnosis not present

## 2016-05-09 DIAGNOSIS — Z6841 Body Mass Index (BMI) 40.0 and over, adult: Secondary | ICD-10-CM | POA: Diagnosis not present

## 2016-05-09 LAB — BAYER DCA HB A1C WAIVED: HB A1C: 7.3 % — AB (ref <7.0)

## 2016-05-09 LAB — HEMOGLOBIN A1C: Hemoglobin A1C: 7.3

## 2016-05-09 MED ORDER — INSULIN GLARGINE 100 UNIT/ML SOLOSTAR PEN: 40.0000 [IU] | PEN_INJECTOR | Freq: Every day | SUBCUTANEOUS | 1 refills | Status: DC

## 2016-05-09 NOTE — Assessment & Plan Note (Signed)
The current medical regimen is effective;  continue present plan and medications.  

## 2016-05-09 NOTE — Assessment & Plan Note (Signed)
Weight loss nutrition

## 2016-05-09 NOTE — Progress Notes (Signed)
BP 138/81 (BP Location: Left Arm, Patient Position: Sitting, Cuff Size: Large)   Pulse 71   Temp 97.8 F (36.6 C)   Ht 5' 4.7" (1.643 m) Comment: with shoes  Wt 258 lb (117 kg) Comment: with shoes  SpO2 97%   BMI 43.33 kg/m    Subjective:    Patient ID: Melissa Singh, female    DOB: 1951-06-29, 65 y.o.   MRN: [id number]  HPI: Melissa Singh is a 65 y.o. female  Chief Complaint  Patient presents with  . Diabetes   She doing well noted low blood sugar spells no complaints taking medications faithfully without side effects. Blood pressure doing well no complaints Cholesterol medicine also doing well with no complaints side effects and taking medicines faithfully  Relevant past medical, surgical, family and social history reviewed and updated as indicated. Interim medical history since our last visit reviewed. Allergies and medications reviewed and updated.  Review of Systems  Constitutional: Negative.   Respiratory: Negative.   Cardiovascular: Negative.     Per HPI unless specifically indicated above     Objective:    BP 138/81 (BP Location: Left Arm, Patient Position: Sitting, Cuff Size: Large)   Pulse 71   Temp 97.8 F (36.6 C)   Ht 5' 4.7" (1.643 m) Comment: with shoes  Wt 258 lb (117 kg) Comment: with shoes  SpO2 97%   BMI 43.33 kg/m   Wt Readings from Last 3 Encounters:  05/09/16 258 lb (117 kg)  02/03/16 255 lb (115.7 kg)  11/02/15 256 lb (116.1 kg)    Physical Exam  Constitutional: She is oriented to person, place, and time. She appears well-developed and well-nourished. No distress.  HENT:  Head: Normocephalic and atraumatic.  Right Ear: Hearing normal.  Left Ear: Hearing normal.  Nose: Nose normal.  Eyes: Conjunctivae and lids are normal. Right eye exhibits no discharge. Left eye exhibits no discharge. No scleral icterus.  Cardiovascular: Normal rate, regular rhythm and normal heart sounds.   Pulmonary/Chest: Effort normal and breath sounds  normal. No respiratory distress.  Musculoskeletal: Normal range of motion.  Neurological: She is alert and oriented to person, place, and time.  Skin: Skin is intact. No rash noted.  Psychiatric: She has a normal mood and affect. Her speech is normal and behavior is normal. Judgment and thought content normal. Cognition and memory are normal.    Results for orders placed or performed in visit on 04/28/16  HM DIABETES EYE EXAM  Result Value Ref Range   HM Diabetic Eye Exam No Retinopathy No Retinopathy      Assessment & Plan:   Problem List Items Addressed This Visit      Cardiovascular and Mediastinum   Essential hypertension    The current medical regimen is effective;  continue present plan and medications.         Endocrine   Type II diabetes mellitus with renal manifestations ([place]) - Primary    Diabetes improved with hemoglobin A1c coming down we'll continue current medications and care adjusting insulin as previously discussed also discuss weight loss and nutrition      Relevant Medications   Insulin Glargine (LANTUS SOLOSTAR) 100 UNIT/ML Solostar Pen   Other Relevant Orders   Bayer DCA Hb A1c Waived     Other   Body mass index 40.0-44.9, adult (HCC) (Chronic)    Weight loss nutrition      Relevant Medications   Insulin Glargine (LANTUS SOLOSTAR) 100 UNIT/ML Solostar Pen  Hypercholesteremia    The current medical regimen is effective;  continue present plan and medications.        Other Visit Diagnoses   None.      Follow up plan: Return in about 3 months (around 08/09/2016) for Physical Exam, Hemoglobin A1c.

## 2016-05-09 NOTE — Assessment & Plan Note (Signed)
Diabetes improved with hemoglobin A1c coming down we'll continue current medications and care adjusting insulin as previously discussed also discuss weight loss and nutrition

## 2016-06-22 ENCOUNTER — Other Ambulatory Visit: Payer: Self-pay | Admitting: Family Medicine

## 2016-06-22 DIAGNOSIS — N182 Chronic kidney disease, stage 2 (mild): Secondary | ICD-10-CM

## 2016-06-22 DIAGNOSIS — I10 Essential (primary) hypertension: Secondary | ICD-10-CM

## 2016-06-22 DIAGNOSIS — E1122 Type 2 diabetes mellitus with diabetic chronic kidney disease: Secondary | ICD-10-CM

## 2016-06-22 DIAGNOSIS — Z794 Long term (current) use of insulin: Secondary | ICD-10-CM

## 2016-07-21 DIAGNOSIS — H2513 Age-related nuclear cataract, bilateral: Secondary | ICD-10-CM | POA: Diagnosis not present

## 2016-07-26 ENCOUNTER — Other Ambulatory Visit: Payer: Self-pay | Admitting: Family Medicine

## 2016-07-26 DIAGNOSIS — Z794 Long term (current) use of insulin: Principal | ICD-10-CM

## 2016-07-26 DIAGNOSIS — I10 Essential (primary) hypertension: Secondary | ICD-10-CM

## 2016-07-26 DIAGNOSIS — E1122 Type 2 diabetes mellitus with diabetic chronic kidney disease: Secondary | ICD-10-CM

## 2016-07-26 DIAGNOSIS — N182 Chronic kidney disease, stage 2 (mild): Principal | ICD-10-CM

## 2016-07-28 ENCOUNTER — Other Ambulatory Visit: Payer: Self-pay | Admitting: Family Medicine

## 2016-07-28 DIAGNOSIS — E7800 Pure hypercholesterolemia, unspecified: Secondary | ICD-10-CM

## 2016-07-28 DIAGNOSIS — Z794 Long term (current) use of insulin: Principal | ICD-10-CM

## 2016-07-28 DIAGNOSIS — H2513 Age-related nuclear cataract, bilateral: Secondary | ICD-10-CM | POA: Diagnosis not present

## 2016-07-28 DIAGNOSIS — N182 Chronic kidney disease, stage 2 (mild): Principal | ICD-10-CM

## 2016-07-28 DIAGNOSIS — E1122 Type 2 diabetes mellitus with diabetic chronic kidney disease: Secondary | ICD-10-CM

## 2016-07-28 NOTE — Telephone Encounter (Signed)
Routing to provider  

## 2016-08-02 ENCOUNTER — Encounter: Payer: Self-pay | Admitting: *Deleted

## 2016-08-02 NOTE — H&P (Signed)
See scanned note.

## 2016-08-03 ENCOUNTER — Ambulatory Visit: Payer: Medicare Other | Admitting: Anesthesiology

## 2016-08-03 ENCOUNTER — Encounter: Payer: Self-pay | Admitting: *Deleted

## 2016-08-03 ENCOUNTER — Encounter
Admission: RE | Disposition: A | Discharge status: Home / Self Care | Payer: Self-pay | Source: Ambulatory Visit | Attending: Ophthalmology

## 2016-08-03 ENCOUNTER — Ambulatory Visit
Admission: RE | Admit: 2016-08-03 | Discharge: 2016-08-03 | Disposition: A | Discharge status: Home / Self Care | Payer: Medicare Other | Source: Ambulatory Visit | Attending: Ophthalmology | Admitting: Ophthalmology

## 2016-08-03 DIAGNOSIS — E1036 Type 1 diabetes mellitus with diabetic cataract: Secondary | ICD-10-CM | POA: Insufficient documentation

## 2016-08-03 DIAGNOSIS — I10 Essential (primary) hypertension: Secondary | ICD-10-CM | POA: Diagnosis not present

## 2016-08-03 DIAGNOSIS — J45909 Unspecified asthma, uncomplicated: Secondary | ICD-10-CM | POA: Insufficient documentation

## 2016-08-03 DIAGNOSIS — F419 Anxiety disorder, unspecified: Secondary | ICD-10-CM | POA: Insufficient documentation

## 2016-08-03 DIAGNOSIS — D649 Anemia, unspecified: Secondary | ICD-10-CM | POA: Diagnosis not present

## 2016-08-03 DIAGNOSIS — I209 Angina pectoris, unspecified: Secondary | ICD-10-CM | POA: Insufficient documentation

## 2016-08-03 DIAGNOSIS — Z6841 Body Mass Index (BMI) 40.0 and over, adult: Secondary | ICD-10-CM | POA: Insufficient documentation

## 2016-08-03 DIAGNOSIS — E669 Obesity, unspecified: Secondary | ICD-10-CM | POA: Diagnosis not present

## 2016-08-03 DIAGNOSIS — K219 Gastro-esophageal reflux disease without esophagitis: Secondary | ICD-10-CM | POA: Diagnosis not present

## 2016-08-03 DIAGNOSIS — H2511 Age-related nuclear cataract, right eye: Secondary | ICD-10-CM | POA: Diagnosis not present

## 2016-08-03 DIAGNOSIS — H2513 Age-related nuclear cataract, bilateral: Secondary | ICD-10-CM | POA: Diagnosis not present

## 2016-08-03 HISTORY — DX: Irritable bowel syndrome, unspecified: K58.9

## 2016-08-03 HISTORY — DX: Cough, unspecified: R05.9

## 2016-08-03 HISTORY — PX: CATARACT EXTRACTION W/PHACO: SHX586

## 2016-08-03 HISTORY — DX: Angina pectoris, unspecified: I20.9

## 2016-08-03 HISTORY — DX: Wheezing: R06.2

## 2016-08-03 HISTORY — DX: Palpitations: R00.2

## 2016-08-03 HISTORY — DX: Nontoxic single thyroid nodule: E04.1

## 2016-08-03 HISTORY — DX: Cardiac murmur, unspecified: R01.1

## 2016-08-03 HISTORY — DX: Cough: R05

## 2016-08-03 LAB — GLUCOSE, CAPILLARY: GLUCOSE-CAPILLARY: 146 mg/dL — AB (ref 65–99)

## 2016-08-03 SURGERY — PHACOEMULSIFICATION, CATARACT, WITH IOL INSERTION
Anesthesia: Monitor Anesthesia Care | Laterality: Right

## 2016-08-03 MED ORDER — NA CHONDROIT SULF-NA HYALURON 40-17 MG/ML IO SOLN
INTRAOCULAR | Status: DC | PRN
  Administered 2016-08-03: 1 mL via INTRAOCULAR

## 2016-08-03 MED ORDER — ACETYLCHOLINE CHLORIDE 20 MG IO SOLR
INTRAOCULAR | Status: AC
  Filled 2016-08-03: qty 1

## 2016-08-03 MED ORDER — EPINEPHRINE PF 1 MG/ML IJ SOLN
INTRAMUSCULAR | Status: AC
  Filled 2016-08-03: qty 2

## 2016-08-03 MED ORDER — MOXIFLOXACIN HCL 0.5 % OP SOLN
1.0000 [drp] | OPHTHALMIC | Status: AC | PRN
  Administered 2016-08-03 (×3): 1 [drp] via OPHTHALMIC

## 2016-08-03 MED ORDER — MIDAZOLAM HCL 2 MG/2ML IJ SOLN
INTRAMUSCULAR | Status: DC | PRN
  Administered 2016-08-03: 1 mg via INTRAVENOUS

## 2016-08-03 MED ORDER — POVIDONE-IODINE 5 % OP SOLN
OPHTHALMIC | Status: AC
  Filled 2016-08-03: qty 30

## 2016-08-03 MED ORDER — MOXIFLOXACIN HCL 0.5 % OP SOLN
OPHTHALMIC | Status: DC | PRN
  Administered 2016-08-03: 2 [drp] via OPHTHALMIC

## 2016-08-03 MED ORDER — HYALURONIDASE HUMAN 150 UNIT/ML IJ SOLN
INTRAMUSCULAR | Status: AC
  Filled 2016-08-03: qty 1

## 2016-08-03 MED ORDER — PHENYLEPHRINE HCL 10 % OP SOLN
OPHTHALMIC | Status: AC
Start: 2016-08-03 — End: 2016-08-03
  Administered 2016-08-03: 07:00:00
  Filled 2016-08-03: qty 5

## 2016-08-03 MED ORDER — CEFUROXIME OPHTHALMIC INJECTION 1 MG/0.1 ML
INJECTION | OPHTHALMIC | Status: DC | PRN
  Administered 2016-08-03: 0.1 mL via INTRACAMERAL

## 2016-08-03 MED ORDER — PHENYLEPHRINE HCL 10 % OP SOLN
OPHTHALMIC | Status: AC
  Filled 2016-08-03: qty 5

## 2016-08-03 MED ORDER — TETRACAINE HCL 0.5 % OP SOLN
OPHTHALMIC | Status: AC
  Filled 2016-08-03: qty 2

## 2016-08-03 MED ORDER — CEFUROXIME OPHTHALMIC INJECTION 1 MG/0.1 ML
INJECTION | OPHTHALMIC | Status: AC
  Filled 2016-08-03: qty 0.1

## 2016-08-03 MED ORDER — POVIDONE-IODINE 5 % OP SOLN
OPHTHALMIC | Status: DC | PRN
  Administered 2016-08-03: 1 via OPHTHALMIC

## 2016-08-03 MED ORDER — CYCLOPENTOLATE HCL 2 % OP SOLN
OPHTHALMIC | Status: AC
  Filled 2016-08-03: qty 2

## 2016-08-03 MED ORDER — PHENYLEPHRINE HCL 10 % OP SOLN
1.0000 [drp] | OPHTHALMIC | Status: AC | PRN
  Administered 2016-08-03 (×4): 1 [drp] via OPHTHALMIC

## 2016-08-03 MED ORDER — BUPIVACAINE HCL (PF) 0.75 % IJ SOLN
INTRAMUSCULAR | Status: DC | PRN
  Administered 2016-08-03: 6 mL via OPHTHALMIC

## 2016-08-03 MED ORDER — TRYPAN BLUE 0.06 % OP SOLN
OPHTHALMIC | Status: AC
  Filled 2016-08-03: qty 0.5

## 2016-08-03 MED ORDER — SODIUM CHLORIDE 0.9 % IV SOLN
INTRAVENOUS | Status: DC
  Administered 2016-08-03 (×2): via INTRAVENOUS

## 2016-08-03 MED ORDER — LIDOCAINE HCL (PF) 4 % IJ SOLN
INTRAOCULAR | Status: DC | PRN
  Administered 2016-08-03: .5 mL

## 2016-08-03 MED ORDER — MOXIFLOXACIN HCL 0.5 % OP SOLN
OPHTHALMIC | Status: AC
  Administered 2016-08-03: 07:00:00
  Filled 2016-08-03: qty 3

## 2016-08-03 MED ORDER — ALFENTANIL 500 MCG/ML IJ INJ
INJECTION | INTRAMUSCULAR | Status: DC | PRN
  Administered 2016-08-03 (×2): 250 ug via INTRAVENOUS

## 2016-08-03 MED ORDER — BUPIVACAINE HCL (PF) 0.75 % IJ SOLN
INTRAMUSCULAR | Status: AC
  Filled 2016-08-03: qty 10

## 2016-08-03 MED ORDER — TETRACAINE HCL 0.5 % OP SOLN
OPHTHALMIC | Status: DC | PRN
  Administered 2016-08-03: 2 [drp] via OPHTHALMIC

## 2016-08-03 MED ORDER — LIDOCAINE HCL (PF) 4 % IJ SOLN
INTRAMUSCULAR | Status: AC
  Filled 2016-08-03: qty 5

## 2016-08-03 MED ORDER — LABETALOL HCL 5 MG/ML IV SOLN
INTRAVENOUS | Status: DC | PRN
  Administered 2016-08-03 (×2): 5 mg via INTRAVENOUS

## 2016-08-03 MED ORDER — NA CHONDROIT SULF-NA HYALURON 40-17 MG/ML IO SOLN
INTRAOCULAR | Status: AC
  Filled 2016-08-03: qty 1

## 2016-08-03 MED ORDER — ACETYLCHOLINE CHLORIDE 20 MG IO SOLR
INTRAOCULAR | Status: DC | PRN
  Administered 2016-08-03: 20 mg via INTRAOCULAR

## 2016-08-03 MED ORDER — CYCLOPENTOLATE HCL 2 % OP SOLN
1.0000 [drp] | OPHTHALMIC | Status: AC | PRN
  Administered 2016-08-03 (×4): 1 [drp] via OPHTHALMIC

## 2016-08-03 MED ORDER — ALFENTANIL 500 MCG/ML IJ INJ: INJECTION | INTRAMUSCULAR | Status: DC | PRN

## 2016-08-03 MED ORDER — BSS IO SOLN
INTRAOCULAR | Status: DC | PRN
  Administered 2016-08-03: 250 mL via OPHTHALMIC

## 2016-08-03 SURGICAL SUPPLY — 31 items
CANNULA ANT/CHMB 27GA (MISCELLANEOUS) ×2 IMPLANT
CORD BIP STRL DISP 12FT (MISCELLANEOUS) ×2 IMPLANT
CUP MEDICINE 2OZ PLAST GRAD ST (MISCELLANEOUS) ×2 IMPLANT
DRAPE XRAY CASSETTE 23X24 (DRAPES) ×2 IMPLANT
ERASER HMR WETFIELD 18G (MISCELLANEOUS) ×2 IMPLANT
GLOVE BIO SURGEON STRL SZ8 (GLOVE) ×2 IMPLANT
GLOVE SURG LX 6.5 MICRO (GLOVE) ×1
GLOVE SURG LX 8.0 MICRO (GLOVE) ×1
GLOVE SURG LX STRL 6.5 MICRO (GLOVE) ×1 IMPLANT
GLOVE SURG LX STRL 8.0 MICRO (GLOVE) ×1 IMPLANT
GOWN STRL REUS W/ TWL LRG LVL3 (GOWN DISPOSABLE) ×1 IMPLANT
GOWN STRL REUS W/ TWL XL LVL3 (GOWN DISPOSABLE) ×1 IMPLANT
GOWN STRL REUS W/TWL LRG LVL3 (GOWN DISPOSABLE) ×1
GOWN STRL REUS W/TWL XL LVL3 (GOWN DISPOSABLE) ×1
KIT IRRIGAT 0.9 MICROSMOOTH (MISCELLANEOUS) ×2 IMPLANT
LENS IOL ACRSF IQ ULTRA 25.5 (Intraocular Lens) ×1 IMPLANT
LENS IOL ACRYSOF IQ 25.5 (Intraocular Lens) ×2 IMPLANT
PACK CATARACT (MISCELLANEOUS) ×2 IMPLANT
PACK CATARACT DINGLEDEIN LX (MISCELLANEOUS) ×2 IMPLANT
PACK EYE AFTER SURG (MISCELLANEOUS) ×2 IMPLANT
SHLD EYE VISITEC  UNIV (MISCELLANEOUS) IMPLANT
SOL BSS BAG (MISCELLANEOUS) ×2
SOL PREP PVP 2OZ (MISCELLANEOUS)
SOLUTION BSS BAG (MISCELLANEOUS) ×1 IMPLANT
SOLUTION PREP PVP 2OZ (MISCELLANEOUS) IMPLANT
SUT SILK 5-0 (SUTURE) ×2 IMPLANT
SYR 3ML LL SCALE MARK (SYRINGE) ×2 IMPLANT
SYR 5ML LL (SYRINGE) ×2 IMPLANT
SYR TB 1ML 27GX1/2 LL (SYRINGE) ×2 IMPLANT
WATER STERILE IRR 250ML POUR (IV SOLUTION) ×2 IMPLANT
WIPE NON LINTING 3.25X3.25 (MISCELLANEOUS) ×2 IMPLANT

## 2016-08-03 NOTE — Anesthesia Postprocedure Evaluation (Signed)
Anesthesia Post Note  Patient: Melissa Singh  Procedure(s) Performed: Procedure(s) (LRB): CATARACT EXTRACTION PHACO AND INTRAOCULAR LENS PLACEMENT (IOC) (Right)  Patient location during evaluation: PACU Anesthesia Type: MAC Level of consciousness: awake Pain management: pain level controlled Vital Signs Assessment: post-procedure vital signs reviewed and stable Respiratory status: nonlabored ventilation Cardiovascular status: stable Anesthetic complications: no    Last Vitals:  Vitals:   08/03/16 0844 08/03/16 0857  BP: (!) 156/70 (!) 159/66  Pulse: 80 77  Resp: 16   Temp: 36.4 C     Last Pain:  Vitals:   08/03/16 0844  TempSrc: Temporal                 VAN STAVEREN,[place]

## 2016-08-03 NOTE — Discharge Instructions (Addendum)
Eye Surgery Discharge Instructions  Expect mild scratchy sensation or mild soreness. DO NOT RUB YOUR EYE!  The day of surgery:  Minimal physical activity, but bed rest is not required  No reading, computer work, or close hand work  No bending, lifting, or straining.  May watch TV  For 24 hours:  No driving, legal decisions, or alcoholic beverages  Safety precautions  Eat anything you prefer: It is better to start with liquids, then soup then solid foods.  _____ Eye patch should be worn until postoperative exam tomorrow.  ____ Solar shield eyeglasses should be worn for comfort in the sunlight/patch while sleeping  Resume all regular medications including aspirin or Coumadin if these were discontinued prior to surgery. You may shower, bathe, shave, or wash your hair. Tylenol may be taken for mild discomfort.  Call your doctor if you experience significant pain, nausea, or vomiting, fever > 101 or other signs of infection. [phone] or [phone] Specific instructions:  Follow-up Information    DINGELDEIN,STEVEN, MD .   Specialty:  Ophthalmology Why:  08/04/16 at 9:10 Contact information: [place] [address] [phone]          Eye Surgery Discharge Instructions  Expect mild scratchy sensation or mild soreness. DO NOT RUB YOUR EYE!  The day of surgery:  Minimal physical activity, but bed rest is not required  No reading, computer work, or close hand work  No bending, lifting, or straining.  May watch TV  For 24 hours:  No driving, legal decisions, or alcoholic beverages  Safety precautions  Eat anything you prefer: It is better to start with liquids, then soup then solid foods.  _____ Eye patch should be worn until postoperative exam tomorrow.  ____ Solar shield eyeglasses should be worn for comfort in the sunlight/patch while sleeping  Resume all regular medications including aspirin or Coumadin if these were  discontinued prior to surgery. You may shower, bathe, shave, or wash your hair. Tylenol may be taken for mild discomfort.  Call your doctor if you experience significant pain, nausea, or vomiting, fever > 101 or other signs of infection. [phone] or [phone] Specific instructions:  Follow-up Information    DINGELDEIN,STEVEN, MD .   Specialty:  Ophthalmology Why:  08/04/16 at 9:10 Contact information: [address]   [place] [address] [phone]         See handout.

## 2016-08-03 NOTE — Anesthesia Preprocedure Evaluation (Signed)
Anesthesia Evaluation  Patient identified by MRN, date of birth, ID band Patient awake    Reviewed: Allergy & Precautions, NPO status , Patient's Chart, lab work & pertinent test results  Airway Mallampati: II       Dental  (+) Teeth Intact   Pulmonary asthma ,     + decreased breath sounds      Cardiovascular Exercise Tolerance: Poor hypertension, + angina with exertion  Rhythm:Regular     Neuro/Psych Anxiety    GI/Hepatic Neg liver ROS, GERD  ,  Endo/Other  diabetes, Well Controlled, Type 1, Insulin DependentMorbid obesity  Renal/GU      Musculoskeletal   Abdominal (+) + obese,   Peds  Hematology  (+) anemia ,   Anesthesia Other Findings   Reproductive/Obstetrics                             Anesthesia Physical Anesthesia Plan  ASA: III  Anesthesia Plan: MAC   Post-op Pain Management:    Induction: Intravenous  Airway Management Planned: Natural Airway and Nasal Cannula  Additional Equipment:   Intra-op Plan:   Post-operative Plan:   Informed Consent: I have reviewed the patients History and Physical, chart, labs and discussed the procedure including the risks, benefits and alternatives for the proposed anesthesia with the patient or authorized representative who has indicated his/her understanding and acceptance.     Plan Discussed with: CRNA  Anesthesia Plan Comments:         Anesthesia Quick Evaluation

## 2016-08-03 NOTE — Interval H&P Note (Signed)
History and Physical Interval Note:  08/03/2016 7:30 AM  Melissa Singh  has presented today for surgery, with the diagnosis of cataract  The various methods of treatment have been discussed with the patient and family. After consideration of risks, benefits and other options for treatment, the patient has consented to  Procedure(s): CATARACT EXTRACTION PHACO AND INTRAOCULAR LENS PLACEMENT ([place]) (Right) as a surgical intervention .  The patient's history has been reviewed, patient examined, no change in status, stable for surgery.  I have reviewed the patient's chart and labs.  Questions were answered to the patient's satisfaction.     Jaedyn Lard

## 2016-08-03 NOTE — Transfer of Care (Signed)
Immediate Anesthesia Transfer of Care Note  Patient: Melissa Singh  Procedure(s) Performed: Procedure(s) with comments: CATARACT EXTRACTION PHACO AND INTRAOCULAR LENS PLACEMENT ([facility]) (Right) - fluid pack lot # [id number] [place]  1:05.5   AP    20.8 CDE  25.75   Patient Location: PACU and Short Stay  Anesthesia Type:MAC  Level of Consciousness: awake, oriented and patient cooperative  Airway & Oxygen Therapy: Patient Spontanous Breathing  Post-op Assessment: Report given to RN and Post -op Vital signs reviewed and stable  Post vital signs: Reviewed and stable  Last Vitals:  Vitals:   08/03/16 0606 08/03/16 0844  BP: (!) 177/78 (!) 156/70  Pulse: 71 80  Resp: 16 16  Temp: 36.5 C 36.4 C    Last Pain:  Vitals:   08/03/16 0844  TempSrc: Temporal         Complications: No apparent anesthesia complications

## 2016-08-03 NOTE — Op Note (Signed)
Date of Surgery: 08/03/2016 Date of Dictation: 08/03/2016 8:42 AM Pre-operative Diagnosis:  Nuclear Sclerotic Cataract and Cortical Cataract right Eye Post-operative Diagnosis: same Procedure performed: Extra-capsular Cataract Extraction (ECCE) with placement of a posterior chamber intraocular lens (IOL) right Eye IOL:  Implant Name Type Inv. Item Serial No. Manufacturer Lot No. LRB No. Used  LENS IOL ACRYSOF IQ 25.5 - [id number] Intraocular Lens LENS IOL ACRYSOF IQ 25.5 [id number] ALCON   Right 1   Anesthesia: 2% Lidocaine and 4% Marcaine in a 50/50 mixture with 10 unites/ml of Hylenex given as a peribulbar Anesthesiologist: Anesthesiologist: Gijsbertus Lonia Mad, MD CRNA: Courtney Paris, CRNA Complications: none Estimated Blood Loss: less than 1 ml  Description of procedure:  Patient is latex sensitive and latex free materials were used throughout the case.  The patient was given anesthesia and sedation via intravenous access. The patient was then prepped and draped in the usual fashion. A 25-gauge needle was bent for initiating the capsulorhexis. A 5-0 silk suture was placed through the conjunctiva superior and inferiorly to serve as bridle sutures. Hemostasis was obtained at the superior limbus using an eraser cautery. A partial thickness groove was made at the anterior surgical limbus with a 64 Beaver blade and this was dissected anteriorly with an Avaya. The anterior chamber was entered at 10 o'clock with a 1.0 mm paracentesis knife and through the lamellar dissection with a 2.6 mm Alcon keratome. Epi-Shugarcaine 0.5 CC [9 cc BSS Plus (Alcon), 3 cc 4% preservative-free lidocaine (Hospira) and 4 cc 1:1000 preservative-free, bisulfite-free epinephrine] was injected into the anterior chamber via the paracentesis tract. Epi-Shugarcaine 0.5 CC [9 cc BSS Plus (Alcon), 3 cc 4% preservative-free lidocaine (Hospira) and 4 cc 1:1000 preservative-free, bisulfite-free epinephrine]  was injected into the anterior chamber via the paracentesis tract. DiscoVisc was injected to replace the aqueous and a continuous tear curvilinear capsulorhexis was performed using a bent 25-gauge needle.  Balance salt on a syringe was used to perform hydro-dissection and phacoemulsification was carried out using a divide and conquer technique. Procedure(s) with comments: CATARACT EXTRACTION PHACO AND INTRAOCULAR LENS PLACEMENT ([facility]) (Right) - fluid pack lot # [id number] [place]  1:05.5   AP    20.8 CDE  25.75 . Irrigation/aspiration was used to remove the residual cortex and the capsular bag was inflated with DiscoVisc. The intraocular lens was inserted into the capsular bag using a pre-loaded UltraSert Delivery System. Irrigation/aspiration was used to remove the residual DiscoVisc. The wound was inflated with balanced salt and checked for leaks. None were found. Miochol was injected via the paracentesis track and 0.1 ml of cefuroxime containing 1 mg of drug  was injected via the paracentesis track. The wound was checked for leaks again and none were found.   The bridal sutures were removed and two drops of Vigamox were placed on the eye. An eye shield was placed to protect the eye and the patient was discharged to the recovery area in good condition.   Damiel Barthold MD

## 2016-08-08 DIAGNOSIS — E119 Type 2 diabetes mellitus without complications: Secondary | ICD-10-CM | POA: Diagnosis not present

## 2016-08-08 DIAGNOSIS — E782 Mixed hyperlipidemia: Secondary | ICD-10-CM | POA: Diagnosis not present

## 2016-08-08 DIAGNOSIS — I10 Essential (primary) hypertension: Secondary | ICD-10-CM | POA: Diagnosis not present

## 2016-08-19 DIAGNOSIS — Z23 Encounter for immunization: Secondary | ICD-10-CM | POA: Diagnosis not present

## 2016-08-19 DIAGNOSIS — R935 Abnormal findings on diagnostic imaging of other abdominal regions, including retroperitoneum: Secondary | ICD-10-CM | POA: Diagnosis not present

## 2016-08-19 DIAGNOSIS — K862 Cyst of pancreas: Secondary | ICD-10-CM | POA: Diagnosis not present

## 2016-08-24 DIAGNOSIS — H2512 Age-related nuclear cataract, left eye: Secondary | ICD-10-CM | POA: Diagnosis not present

## 2016-08-29 ENCOUNTER — Encounter: Payer: Self-pay | Admitting: *Deleted

## 2016-08-30 ENCOUNTER — Ambulatory Visit (INDEPENDENT_AMBULATORY_CARE_PROVIDER_SITE_OTHER): Payer: Medicare Other | Admitting: Family Medicine

## 2016-08-30 ENCOUNTER — Encounter: Payer: Self-pay | Admitting: Family Medicine

## 2016-08-30 VITALS — BP 132/83 | HR 69 | Temp 97.6°F | Ht 64.25 in | Wt 258.2 lb

## 2016-08-30 DIAGNOSIS — N182 Chronic kidney disease, stage 2 (mild): Secondary | ICD-10-CM

## 2016-08-30 DIAGNOSIS — E7800 Pure hypercholesterolemia, unspecified: Secondary | ICD-10-CM | POA: Diagnosis not present

## 2016-08-30 DIAGNOSIS — E1122 Type 2 diabetes mellitus with diabetic chronic kidney disease: Secondary | ICD-10-CM

## 2016-08-30 DIAGNOSIS — Z0000 Encounter for general adult medical examination without abnormal findings: Secondary | ICD-10-CM | POA: Diagnosis not present

## 2016-08-30 DIAGNOSIS — K863 Pseudocyst of pancreas: Secondary | ICD-10-CM | POA: Diagnosis not present

## 2016-08-30 DIAGNOSIS — J4520 Mild intermittent asthma, uncomplicated: Secondary | ICD-10-CM

## 2016-08-30 DIAGNOSIS — Z6841 Body Mass Index (BMI) 40.0 and over, adult: Secondary | ICD-10-CM

## 2016-08-30 DIAGNOSIS — Z794 Long term (current) use of insulin: Secondary | ICD-10-CM

## 2016-08-30 DIAGNOSIS — I10 Essential (primary) hypertension: Secondary | ICD-10-CM

## 2016-08-30 LAB — URINALYSIS, ROUTINE W REFLEX MICROSCOPIC
BILIRUBIN UA: NEGATIVE
Glucose, UA: NEGATIVE
Ketones, UA: NEGATIVE
LEUKOCYTES UA: NEGATIVE
Nitrite, UA: NEGATIVE
PH UA: 5 (ref 5.0–7.5)
RBC UA: NEGATIVE
Specific Gravity, UA: 1.02 (ref 1.005–1.030)
Urobilinogen, Ur: 0.2 mg/dL (ref 0.2–1.0)

## 2016-08-30 LAB — MICROSCOPIC EXAMINATION
RBC, UA: NONE SEEN /hpf (ref 0–?)
WBC, UA: NONE SEEN /hpf (ref 0–?)

## 2016-08-30 LAB — BAYER DCA HB A1C WAIVED: HB A1C (BAYER DCA - WAIVED): 7.7 % — ABNORMAL HIGH (ref <7.0)

## 2016-08-30 MED ORDER — LOSARTAN POTASSIUM 100 MG PO TABS: 100.0000 mg | ORAL_TABLET | Freq: Every day | ORAL | 4 refills | Status: DC

## 2016-08-30 MED ORDER — ATORVASTATIN CALCIUM 20 MG PO TABS: 20.0000 mg | ORAL_TABLET | Freq: Every day | ORAL | 4 refills | Status: DC

## 2016-08-30 MED ORDER — DAPAGLIFLOZIN PROPANEDIOL 5 MG PO TABS: 5.0000 mg | ORAL_TABLET | Freq: Every day | ORAL | 4 refills | Status: DC

## 2016-08-30 MED ORDER — DILTIAZEM HCL ER 240 MG PO CP24: 240.0000 mg | ORAL_CAPSULE | Freq: Every day | ORAL | 4 refills | Status: DC

## 2016-08-30 MED ORDER — MONTELUKAST SODIUM 10 MG PO TABS: 10.0000 mg | ORAL_TABLET | Freq: Every day | ORAL | 4 refills | Status: DC

## 2016-08-30 MED ORDER — INSULIN GLARGINE 100 UNIT/ML SOLOSTAR PEN: 40.0000 [IU] | PEN_INJECTOR | Freq: Every day | SUBCUTANEOUS | 4 refills | Status: DC

## 2016-08-30 MED ORDER — ATENOLOL 100 MG PO TABS: 100.0000 mg | ORAL_TABLET | Freq: Every day | ORAL | 4 refills | Status: DC

## 2016-08-30 MED ORDER — LIRAGLUTIDE 18 MG/3ML ~~LOC~~ SOPN: 1.2000 mg | PEN_INJECTOR | Freq: Every day | SUBCUTANEOUS | 4 refills | Status: DC

## 2016-08-30 NOTE — Assessment & Plan Note (Signed)
The current medical regimen is effective;  continue present plan and medications.  

## 2016-08-30 NOTE — Progress Notes (Signed)
BP 132/83 (BP Location: Left Arm, Patient Position: Sitting, Cuff Size: Large)   Pulse 69   Temp 97.6 F (36.4 C)   Ht 5' 4.25" (1.632 m)   Wt 258 lb 3.2 oz (117.1 kg)   BMI 43.98 kg/m    Subjective:    Patient ID: Melissa Singh, female    DOB: Aug 17, 1951, 65 y.o.   MRN: [id number]  HPI: Melissa Singh is a 65 y.o. female  Chief Complaint  Patient presents with  . Annual Exam   AWV metrics met.  Patient for physical exam no complaints doing well. Had cataracts done 3 weeks and another one scheduled for tomorrow. Has done well with this surgery with no issues. Patient's pancreatic cyst is been evaluated 2 times this year and was normal with CEA antigens also normal Diabetes maybe if he is patient's been under a lot of stress with a lot of medical tests and surgeries: On no low blood sugar spells no issues with medications which she has been taking faithfully. Blood pressure cholesterol no issues concerns taking medicines faithfully without side effects. Albuterol has not been used since she got it which is great but hasn't handy.  Relevant past medical, surgical, family and social history reviewed and updated as indicated. Interim medical history since our last visit reviewed. Allergies and medications reviewed and updated.  Review of Systems  Constitutional: Negative.   HENT: Negative.   Eyes: Negative.   Respiratory: Negative.   Cardiovascular: Negative.   Gastrointestinal: Negative.   Endocrine: Negative.   Genitourinary: Negative.   Musculoskeletal: Negative.   Skin: Negative.   Allergic/Immunologic: Negative.   Neurological: Negative.   Hematological: Negative.   Psychiatric/Behavioral: Negative.     Per HPI unless specifically indicated above     Objective:    BP 132/83 (BP Location: Left Arm, Patient Position: Sitting, Cuff Size: Large)   Pulse 69   Temp 97.6 F (36.4 C)   Ht 5' 4.25" (1.632 m)   Wt 258 lb 3.2 oz (117.1 kg)   BMI 43.98 kg/m   Wt  Readings from Last 3 Encounters:  08/30/16 258 lb 3.2 oz (117.1 kg)  08/03/16 250 lb (113.4 kg)  05/09/16 258 lb (117 kg)    Physical Exam  Constitutional: She is oriented to person, place, and time. She appears well-developed and well-nourished.  HENT:  Head: Normocephalic and atraumatic.  Right Ear: External ear normal.  Left Ear: External ear normal.  Nose: Nose normal.  Mouth/Throat: Oropharynx is clear and moist.  Eyes: Conjunctivae and EOM are normal. Pupils are equal, round, and reactive to light.  Neck: Normal range of motion. Neck supple. Carotid bruit is not present.  Cardiovascular: Normal rate, regular rhythm and normal heart sounds.   No murmur heard. Pulmonary/Chest: Effort normal and breath sounds normal. She exhibits no mass. Right breast exhibits no mass, no skin change and no tenderness. Left breast exhibits no mass, no skin change and no tenderness. Breasts are symmetrical.  Abdominal: Soft. Bowel sounds are normal. There is no hepatosplenomegaly.  Musculoskeletal: Normal range of motion.  Neurological: She is alert and oriented to person, place, and time.  Skin: No rash noted.  Psychiatric: She has a normal mood and affect. Her behavior is normal. Judgment and thought content normal.    Results for orders placed or performed during the hospital encounter of 08/03/16  Glucose, capillary  Result Value Ref Range   Glucose-Capillary 146 (H) 65 - 99 mg/dL  Assessment & Plan:   Problem List Items Addressed This Visit      Cardiovascular and Mediastinum   Essential hypertension    The current medical regimen is effective;  continue present plan and medications.       Relevant Medications   losartan (COZAAR) 100 MG tablet   diltiazem (DILACOR XR) 240 MG 24 hr capsule   atorvastatin (LIPITOR) 20 MG tablet   atenolol (TENORMIN) 100 MG tablet   Other Relevant Orders   Comprehensive metabolic panel   CBC with Differential/Platelet   TSH   Urinalysis,  Routine w reflex microscopic (not at [facility])     Respiratory   Asthma    The current medical regimen is effective;  continue present plan and medications.       Relevant Medications   montelukast (SINGULAIR) 10 MG tablet     Digestive   Pancreatic pseudocyst    The current medical regimen is effective;  continue present plan and medications.         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 Discussed issues and concerns about diabetes and need for better control. Due to our inability to maintain or achieve control of diabetes will refer to endocrinology to further adjust change medications as appropriate to achieve better control. Discussed again the importance of lifestyle weight loss.      Relevant Medications   losartan (COZAAR) 100 MG tablet   liraglutide (VICTOZA) 18 MG/3ML SOPN   Insulin Glargine (LANTUS SOLOSTAR) 100 UNIT/ML Solostar Pen   dapagliflozin propanediol (FARXIGA) 5 MG TABS tablet   atorvastatin (LIPITOR) 20 MG tablet   Other Relevant Orders   Comprehensive metabolic panel   CBC with Differential/Platelet   TSH   Bayer DCA Hb A1c Waived   Ambulatory referral to Endocrinology     Other   Body mass index 40.0-44.9, adult (HCC) (Chronic)    Discuss weight loss diet exercise nutrition      Relevant Medications   liraglutide (VICTOZA) 18 MG/3ML SOPN   Insulin Glargine (LANTUS SOLOSTAR) 100 UNIT/ML Solostar Pen   dapagliflozin propanediol (FARXIGA) 5 MG TABS tablet   Hypercholesteremia    The current medical regimen is effective;  continue present plan and medications.       Relevant Medications   losartan (COZAAR) 100 MG tablet   diltiazem (DILACOR XR) 240 MG 24 hr capsule   atorvastatin (LIPITOR) 20 MG tablet   atenolol (TENORMIN) 100 MG tablet   Other Relevant Orders   Lipid panel   TSH    Other Visit Diagnoses    PE  (physical exam), annual           Follow up plan: Return in about 6 months (around 02/27/2017) for BMP,  Lipids, ALT, AST.

## 2016-08-30 NOTE — H&P (Signed)
See scanned note.

## 2016-08-30 NOTE — Assessment & Plan Note (Signed)
Patient's diabetes is maintaining poor control with hemoglobin A1c of 7.7 today. Discussed issues and concerns about diabetes and need for better control. Due to our inability to maintain or achieve control of diabetes will refer to endocrinology to further adjust change medications as appropriate to achieve better control. Discussed again the importance of lifestyle weight loss.

## 2016-08-30 NOTE — Assessment & Plan Note (Signed)
Discuss weight loss diet exercise nutrition 

## 2016-08-31 ENCOUNTER — Encounter
Admission: RE | Disposition: A | Discharge status: Home / Self Care | Payer: Self-pay | Source: Ambulatory Visit | Attending: Ophthalmology

## 2016-08-31 ENCOUNTER — Ambulatory Visit: Payer: Medicare Other | Admitting: Anesthesiology

## 2016-08-31 ENCOUNTER — Ambulatory Visit
Admission: RE | Admit: 2016-08-31 | Discharge: 2016-08-31 | Disposition: A | Discharge status: Home / Self Care | Payer: Medicare Other | Source: Ambulatory Visit | Attending: Ophthalmology | Admitting: Ophthalmology

## 2016-08-31 ENCOUNTER — Encounter: Payer: Self-pay | Admitting: *Deleted

## 2016-08-31 ENCOUNTER — Other Ambulatory Visit: Payer: Self-pay | Admitting: Family Medicine

## 2016-08-31 DIAGNOSIS — E1136 Type 2 diabetes mellitus with diabetic cataract: Secondary | ICD-10-CM | POA: Diagnosis not present

## 2016-08-31 DIAGNOSIS — Z794 Long term (current) use of insulin: Secondary | ICD-10-CM | POA: Insufficient documentation

## 2016-08-31 DIAGNOSIS — Z79899 Other long term (current) drug therapy: Secondary | ICD-10-CM | POA: Insufficient documentation

## 2016-08-31 DIAGNOSIS — K219 Gastro-esophageal reflux disease without esophagitis: Secondary | ICD-10-CM | POA: Diagnosis not present

## 2016-08-31 DIAGNOSIS — M1990 Unspecified osteoarthritis, unspecified site: Secondary | ICD-10-CM | POA: Insufficient documentation

## 2016-08-31 DIAGNOSIS — D649 Anemia, unspecified: Secondary | ICD-10-CM | POA: Insufficient documentation

## 2016-08-31 DIAGNOSIS — R7989 Other specified abnormal findings of blood chemistry: Secondary | ICD-10-CM

## 2016-08-31 DIAGNOSIS — I10 Essential (primary) hypertension: Secondary | ICD-10-CM | POA: Diagnosis not present

## 2016-08-31 DIAGNOSIS — Z6841 Body Mass Index (BMI) 40.0 and over, adult: Secondary | ICD-10-CM | POA: Insufficient documentation

## 2016-08-31 DIAGNOSIS — R748 Abnormal levels of other serum enzymes: Secondary | ICD-10-CM

## 2016-08-31 DIAGNOSIS — F419 Anxiety disorder, unspecified: Secondary | ICD-10-CM | POA: Insufficient documentation

## 2016-08-31 DIAGNOSIS — J45909 Unspecified asthma, uncomplicated: Secondary | ICD-10-CM | POA: Diagnosis not present

## 2016-08-31 DIAGNOSIS — H2512 Age-related nuclear cataract, left eye: Secondary | ICD-10-CM | POA: Diagnosis not present

## 2016-08-31 HISTORY — PX: CATARACT EXTRACTION W/PHACO: SHX586

## 2016-08-31 LAB — COMPREHENSIVE METABOLIC PANEL
A/G RATIO: 1.3 (ref 1.2–2.2)
ALT: 21 IU/L (ref 0–32)
AST: 20 IU/L (ref 0–40)
Albumin: 4 g/dL (ref 3.6–4.8)
Alkaline Phosphatase: 132 IU/L — ABNORMAL HIGH (ref 39–117)
BUN/Creatinine Ratio: 21 (ref 12–28)
BUN: 24 mg/dL (ref 8–27)
Bilirubin Total: 0.5 mg/dL (ref 0.0–1.2)
CALCIUM: 10.5 mg/dL — AB (ref 8.7–10.3)
CO2: 20 mmol/L (ref 18–29)
CREATININE: 1.16 mg/dL — AB (ref 0.57–1.00)
Chloride: 100 mmol/L (ref 96–106)
GFR, EST AFRICAN AMERICAN: 56 mL/min/{1.73_m2} — AB (ref >59)
GFR, EST NON AFRICAN AMERICAN: 49 mL/min/{1.73_m2} — AB (ref >59)
GLOBULIN, TOTAL: 3.1 g/dL (ref 1.5–4.5)
Glucose: 129 mg/dL — ABNORMAL HIGH (ref 65–99)
POTASSIUM: 5 mmol/L (ref 3.5–5.2)
Sodium: 140 mmol/L (ref 134–144)
TOTAL PROTEIN: 7.1 g/dL (ref 6.0–8.5)

## 2016-08-31 LAB — CBC WITH DIFFERENTIAL/PLATELET
BASOS ABS: 0 10*3/uL (ref 0.0–0.2)
Basos: 0 %
EOS (ABSOLUTE): 0.5 10*3/uL — AB (ref 0.0–0.4)
Eos: 6 %
Hematocrit: 42.8 % (ref 34.0–46.6)
Hemoglobin: 13.9 g/dL (ref 11.1–15.9)
Immature Grans (Abs): 0 10*3/uL (ref 0.0–0.1)
Immature Granulocytes: 0 %
LYMPHS ABS: 1.7 10*3/uL (ref 0.7–3.1)
Lymphs: 21 %
MCH: 26.8 pg (ref 26.6–33.0)
MCHC: 32.5 g/dL (ref 31.5–35.7)
MCV: 83 fL (ref 79–97)
MONOCYTES: 5 %
MONOS ABS: 0.4 10*3/uL (ref 0.1–0.9)
NEUTROS ABS: 5.6 10*3/uL (ref 1.4–7.0)
Neutrophils: 68 %
PLATELETS: 284 10*3/uL (ref 150–379)
RBC: 5.19 x10E6/uL (ref 3.77–5.28)
RDW: 16 % — AB (ref 12.3–15.4)
WBC: 8.2 10*3/uL (ref 3.4–10.8)

## 2016-08-31 LAB — LIPID PANEL
CHOL/HDL RATIO: 3.4 ratio (ref 0.0–4.4)
Cholesterol, Total: 186 mg/dL (ref 100–199)
HDL: 55 mg/dL (ref >39)
LDL CALC: 72 mg/dL (ref 0–99)
TRIGLYCERIDES: 294 mg/dL — AB (ref 0–149)
VLDL Cholesterol Cal: 59 mg/dL — ABNORMAL HIGH (ref 5–40)

## 2016-08-31 LAB — TSH: TSH: 5.95 u[IU]/mL — AB (ref 0.450–4.500)

## 2016-08-31 LAB — GLUCOSE, CAPILLARY: GLUCOSE-CAPILLARY: 137 mg/dL — AB (ref 65–99)

## 2016-08-31 SURGERY — PHACOEMULSIFICATION, CATARACT, WITH IOL INSERTION
Anesthesia: Monitor Anesthesia Care | Site: Eye | Laterality: Left | Wound class: Clean

## 2016-08-31 MED ORDER — SODIUM CHLORIDE 0.9 % IV SOLN
INTRAVENOUS | Status: DC
  Administered 2016-08-31: 07:00:00 via INTRAVENOUS

## 2016-08-31 MED ORDER — TETRACAINE HCL 0.5 % OP SOLN
OPHTHALMIC | Status: AC
  Filled 2016-08-31: qty 2

## 2016-08-31 MED ORDER — MOXIFLOXACIN HCL 0.5 % OP SOLN
OPHTHALMIC | Status: AC
  Administered 2016-08-31: 1 [drp] via OPHTHALMIC
  Filled 2016-08-31: qty 3

## 2016-08-31 MED ORDER — CEFUROXIME OPHTHALMIC INJECTION 1 MG/0.1 ML
INJECTION | OPHTHALMIC | Status: AC
Start: 2016-08-31 — End: 2016-08-31
  Filled 2016-08-31: qty 0.1

## 2016-08-31 MED ORDER — POVIDONE-IODINE 5 % OP SOLN
OPHTHALMIC | Status: DC | PRN
  Administered 2016-08-31: 1 via OPHTHALMIC

## 2016-08-31 MED ORDER — CYCLOPENTOLATE HCL 2 % OP SOLN
1.0000 [drp] | OPHTHALMIC | Status: AC
  Administered 2016-08-31 (×3): 1 [drp] via OPHTHALMIC

## 2016-08-31 MED ORDER — EPINEPHRINE PF 1 MG/ML IJ SOLN
INTRAMUSCULAR | Status: AC
  Filled 2016-08-31: qty 2

## 2016-08-31 MED ORDER — ALFENTANIL 500 MCG/ML IJ INJ
INJECTION | INTRAMUSCULAR | Status: DC | PRN
  Administered 2016-08-31: 750 ug via INTRAVENOUS

## 2016-08-31 MED ORDER — MIDAZOLAM HCL 2 MG/2ML IJ SOLN
INTRAMUSCULAR | Status: DC | PRN
  Administered 2016-08-31: 1 mg via INTRAVENOUS

## 2016-08-31 MED ORDER — POVIDONE-IODINE 5 % OP SOLN
OPHTHALMIC | Status: AC
  Filled 2016-08-31: qty 30

## 2016-08-31 MED ORDER — PHENYLEPHRINE HCL 10 % OP SOLN
1.0000 [drp] | OPHTHALMIC | Status: DC
  Administered 2016-08-31 (×3): 1 [drp] via OPHTHALMIC

## 2016-08-31 MED ORDER — BSS IO SOLN
INTRAOCULAR | Status: DC | PRN
  Administered 2016-08-31: 1 mL via OPHTHALMIC

## 2016-08-31 MED ORDER — CARBACHOL 0.01 % IO SOLN
INTRAOCULAR | Status: DC | PRN
  Administered 2016-08-31: 0.5 mL via INTRAOCULAR

## 2016-08-31 MED ORDER — CEFUROXIME OPHTHALMIC INJECTION 1 MG/0.1 ML
INJECTION | OPHTHALMIC | Status: DC | PRN
  Administered 2016-08-31: 0.1 mL via INTRACAMERAL

## 2016-08-31 MED ORDER — NA CHONDROIT SULF-NA HYALURON 40-17 MG/ML IO SOLN
INTRAOCULAR | Status: AC
  Filled 2016-08-31: qty 1

## 2016-08-31 MED ORDER — CYCLOPENTOLATE HCL 2 % OP SOLN
OPHTHALMIC | Status: AC
  Administered 2016-08-31: 1 [drp] via OPHTHALMIC
  Filled 2016-08-31: qty 2

## 2016-08-31 MED ORDER — NA CHONDROIT SULF-NA HYALURON 40-17 MG/ML IO SOLN
INTRAOCULAR | Status: DC | PRN
  Administered 2016-08-31: 1 mL via INTRAOCULAR

## 2016-08-31 MED ORDER — HYALURONIDASE HUMAN 150 UNIT/ML IJ SOLN
INTRAMUSCULAR | Status: AC
  Filled 2016-08-31: qty 1

## 2016-08-31 MED ORDER — MOXIFLOXACIN HCL 0.5 % OP SOLN
1.0000 [drp] | OPHTHALMIC | Status: AC
  Administered 2016-08-31 (×3): 1 [drp] via OPHTHALMIC

## 2016-08-31 MED ORDER — PHENYLEPHRINE HCL 10 % OP SOLN
OPHTHALMIC | Status: AC
  Administered 2016-08-31: 1 [drp] via OPHTHALMIC
  Filled 2016-08-31: qty 5

## 2016-08-31 MED ORDER — LIDOCAINE HCL (PF) 4 % IJ SOLN
INTRAMUSCULAR | Status: AC
  Filled 2016-08-31: qty 5

## 2016-08-31 MED ORDER — MOXIFLOXACIN HCL 0.5 % OP SOLN
OPHTHALMIC | Status: DC | PRN
  Administered 2016-08-31: 1 [drp] via OPHTHALMIC

## 2016-08-31 MED ORDER — PROPARACAINE HCL 0.5 % OP SOLN
OPHTHALMIC | Status: AC
  Filled 2016-08-31: qty 15

## 2016-08-31 MED ORDER — LIDOCAINE HCL (PF) 4 % IJ SOLN
INTRAMUSCULAR | Status: DC | PRN
  Administered 2016-08-31: 9 mL via OPHTHALMIC

## 2016-08-31 MED ORDER — LIDOCAINE HCL (PF) 4 % IJ SOLN
INTRAMUSCULAR | Status: DC | PRN
  Administered 2016-08-31: 4 mL via OPHTHALMIC

## 2016-08-31 MED ORDER — EPINEPHRINE PF 1 MG/ML IJ SOLN
INTRAMUSCULAR | Status: AC
  Filled 2016-08-31: qty 1

## 2016-08-31 MED ORDER — BUPIVACAINE HCL (PF) 0.75 % IJ SOLN
INTRAMUSCULAR | Status: AC
  Filled 2016-08-31: qty 10

## 2016-08-31 SURGICAL SUPPLY — 30 items
CANNULA ANT/CHMB 27GA (MISCELLANEOUS) ×2 IMPLANT
CORD BIP STRL DISP 12FT (MISCELLANEOUS) ×2 IMPLANT
CUP MEDICINE 2OZ PLAST GRAD ST (MISCELLANEOUS) ×2 IMPLANT
DRAPE XRAY CASSETTE 23X24 (DRAPES) ×2 IMPLANT
ERASER HMR WETFIELD 18G (MISCELLANEOUS) ×2 IMPLANT
GLOVE BIO SURGEON STRL SZ8 (GLOVE) ×2 IMPLANT
GLOVE SURG LX 6.5 MICRO (GLOVE) ×1
GLOVE SURG LX 8.0 MICRO (GLOVE) ×1
GLOVE SURG LX STRL 6.5 MICRO (GLOVE) ×1 IMPLANT
GLOVE SURG LX STRL 8.0 MICRO (GLOVE) ×1 IMPLANT
GOWN STRL REUS W/ TWL LRG LVL3 (GOWN DISPOSABLE) ×1 IMPLANT
GOWN STRL REUS W/ TWL XL LVL3 (GOWN DISPOSABLE) ×1 IMPLANT
GOWN STRL REUS W/TWL LRG LVL3 (GOWN DISPOSABLE) ×1
GOWN STRL REUS W/TWL XL LVL3 (GOWN DISPOSABLE) ×1
LENS IOL ACRSF IQ ULTRA 24.5 (Intraocular Lens) ×1 IMPLANT
LENS IOL ACRYSOF IQ 24.5 (Intraocular Lens) ×2 IMPLANT
PACK CATARACT (MISCELLANEOUS) ×2 IMPLANT
PACK CATARACT DINGLEDEIN LX (MISCELLANEOUS) ×2 IMPLANT
PACK EYE AFTER SURG (MISCELLANEOUS) ×2 IMPLANT
SHLD EYE VISITEC  UNIV (MISCELLANEOUS) ×2 IMPLANT
SOL BSS BAG (MISCELLANEOUS) ×2
SOL PREP PVP 2OZ (MISCELLANEOUS) ×2
SOLUTION BSS BAG (MISCELLANEOUS) ×1 IMPLANT
SOLUTION PREP PVP 2OZ (MISCELLANEOUS) ×1 IMPLANT
SUT SILK 5-0 (SUTURE) ×2 IMPLANT
SYR 3ML LL SCALE MARK (SYRINGE) ×2 IMPLANT
SYR 5ML LL (SYRINGE) ×2 IMPLANT
SYR TB 1ML 27GX1/2 LL (SYRINGE) ×2 IMPLANT
WATER STERILE IRR 250ML POUR (IV SOLUTION) ×2 IMPLANT
WIPE NON LINTING 3.25X3.25 (MISCELLANEOUS) ×2 IMPLANT

## 2016-08-31 NOTE — Transfer of Care (Signed)
Immediate Anesthesia Transfer of Care Note  Patient: Melissa Singh  Procedure(s) Performed: Procedure(s) with comments: CATARACT EXTRACTION PHACO AND INTRAOCULAR LENS PLACEMENT ([facility]) (Left) - [place] 49.9 AP% 21.8 CDE 20.41 Fluid pack lot # [id number]  Patient Location: Short Stay  Anesthesia Type:MAC  Level of Consciousness: awake and alert   Airway & Oxygen Therapy: Patient Spontanous Breathing  Post-op Assessment: Report given to RN and Post -op Vital signs reviewed and stable  Post vital signs: Reviewed  Last Vitals:  Vitals:   08/31/16 0817 08/31/16 0820  BP: (!) 136/53 (!) 136/53  Pulse: 66 66  Resp: 17 18  Temp: 36.8 C 36.8 C    Last Pain:  Vitals:   08/31/16 0626  TempSrc: Oral         Complications: No apparent anesthesia complications

## 2016-08-31 NOTE — Anesthesia Preprocedure Evaluation (Signed)
Anesthesia Evaluation  Patient identified by MRN, date of birth, ID band Patient awake    Reviewed: Allergy & Precautions, NPO status , Patient's Chart, lab work & pertinent test results  History of Anesthesia Complications Negative for: history of anesthetic complications  Airway Mallampati: IV  TM Distance: >3 FB Neck ROM: Full    Dental no notable dental hx.    Pulmonary asthma , neg sleep apnea,    breath sounds clear to auscultation- rhonchi (-) wheezing      Cardiovascular hypertension, Pt. on medications (-) CAD and (-) Past MI  Rhythm:Regular Rate:Normal - Systolic murmurs and - Diastolic murmurs    Neuro/Psych negative neurological ROS  negative psych ROS   GI/Hepatic Neg liver ROS, GERD  ,  Endo/Other  diabetes, Type 2, Insulin Dependent, Oral Hypoglycemic Agents  Renal/GU CRFRenal disease     Musculoskeletal  (+) Arthritis ,   Abdominal (+) + obese,   Peds  Hematology  (+) anemia ,   Anesthesia Other Findings Past Medical History: No date: Anemia No date: Anginal pain ([place]) No date: Anxiety No date: Arthritis No date: Asthma No date: Body mass index 40.0-44.9, adult (HCC) No date: Cough No date: GERD (gastroesophageal reflux disease) No date: Heart murmur No date: Hypertension No date: IBS (irritable bowel syndrome) No date: Kidney stone 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 No date: Palpitations No date: Pancreatic pseudocyst No date: Thyroid cyst No date: Type II diabetes mellitus with renal manifesta* No date: Wheezing   Reproductive/Obstetrics                             Anesthesia Physical Anesthesia Plan  ASA: III  Anesthesia Plan: MAC   Post-op Pain Management:    Induction: Intravenous  Airway Management Planned: Natural Airway  Additional Equipment:   Intra-op Plan:   Post-operative Plan:   Informed Consent: I have reviewed the patients History and  Physical, chart, labs and discussed the procedure including the risks, benefits and alternatives for the proposed anesthesia with the patient or authorized representative who has indicated his/her understanding and acceptance.   Dental advisory given  Plan Discussed with: CRNA and Anesthesiologist  Anesthesia Plan Comments:         Anesthesia Quick Evaluation

## 2016-08-31 NOTE — Anesthesia Postprocedure Evaluation (Signed)
Anesthesia Post Note  Patient: Melissa Singh  Procedure(s) Performed: Procedure(s) (LRB): CATARACT EXTRACTION PHACO AND INTRAOCULAR LENS PLACEMENT ([facility]) (Left)  Patient location during evaluation: Short Stay Anesthesia Type: MAC Level of consciousness: awake and alert Pain management: pain level controlled Vital Signs Assessment: post-procedure vital signs reviewed and stable Respiratory status: spontaneous breathing Cardiovascular status: blood pressure returned to baseline Postop Assessment: no headache Anesthetic complications: no    Last Vitals:  Vitals:   08/31/16 0817 08/31/16 0820  BP: (!) 136/53 (!) 136/53  Pulse: 66 66  Resp: 17 18  Temp: 36.8 C 36.8 C    Last Pain:  Vitals:   08/31/16 0626  TempSrc: Oral                 Brantley Fling

## 2016-08-31 NOTE — Op Note (Signed)
Date of Surgery: 08/31/2016 Date of Dictation: 08/31/2016 8:17 AM Pre-operative Diagnosis:  Nuclear Sclerotic Cataract and Cortical Cataract left Eye Post-operative Diagnosis: same Procedure performed: Extra-capsular Cataract Extraction (ECCE) with placement of a posterior chamber intraocular lens (IOL) left Eye IOL:  Implant Name Type Inv. Item Serial No. Manufacturer Lot No. LRB No. Used  LENS IOL ACRYSOF IQ 24.5 - [id number] Intraocular Lens LENS IOL ACRYSOF IQ 24.5 [id number] ALCON   Left 1   Anesthesia: 2% Lidocaine and 4% Marcaine in a 50/50 mixture with 10 unites/ml of Hylenex given as a peribulbar Anesthesiologist: Anesthesiologist: Emmie Niemann, MD CRNA: Rolla Plate, CRNA Complications: none Estimated Blood Loss: less than 1 ml  Description of procedure:  The patient was given anesthesia and sedation via intravenous access. The patient was then prepped and draped in the usual fashion. A 25-gauge needle was bent for initiating the capsulorhexis. A 5-0 silk suture was placed through the conjunctiva superior and inferiorly to serve as bridle sutures. Hemostasis was obtained at the superior limbus using an eraser cautery. A partial thickness groove was made at the anterior surgical limbus with a 64 Beaver blade and this was dissected anteriorly with an Avaya. The anterior chamber was entered at 10 o'clock with a 1.0 mm paracentesis knife and through the lamellar dissection with a 2.6 mm Alcon keratome. Epi-Shugarcaine 0.5 CC [9 cc BSS Plus (Alcon), 3 cc 4% preservative-free lidocaine (Hospira) and 4 cc 1:1000 preservative-free, bisulfite-free epinephrine] was injected into the anterior chamber via the paracentesis tract. Epi-Shugarcaine 0.5 CC [9 cc BSS Plus (Alcon), 3 cc 4% preservative-free lidocaine (Hospira) and 4 cc 1:1000 preservative-free, bisulfite-free epinephrine] was injected into the anterior chamber via the paracentesis tract. DiscoVisc was injected to  replace the aqueous and a continuous tear curvilinear capsulorhexis was performed using a bent 25-gauge needle.  Balance salt on a syringe was used to perform hydro-dissection and phacoemulsification was carried out using a divide and conquer technique. Procedure(s) with comments: CATARACT EXTRACTION PHACO AND INTRAOCULAR LENS PLACEMENT ([facility]) (Left) - [place] 49.9 AP% 21.8 CDE 20.41 Fluid pack lot # [id number]. Irrigation/aspiration was used to remove the residual cortex and the capsular bag was inflated with DiscoVisc. The intraocular lens was inserted into the capsular bag using a pre-loaded UltraSert Delivery System. Irrigation/aspiration was used to remove the residual DiscoVisc. The wound was inflated with balanced salt and checked for leaks. None were found. Miostat was injected via the paracentesis track and 0.1 ml of cefuroxime containing 1 mg of drug  was injected via the paracentesis track. The wound was checked for leaks again and none were found.   The bridal sutures were removed and two drops of Vigamox were placed on the eye. An eye shield was placed to protect the eye and the patient was discharged to the recovery area in good condition.   Niko Penson MD

## 2016-08-31 NOTE — Discharge Instructions (Signed)
Eye Surgery Discharge Instructions  Expect mild scratchy sensation or mild soreness. DO NOT RUB YOUR EYE!  The day of surgery:  Minimal physical activity, but bed rest is not required  No reading, computer work, or close hand work  No bending, lifting, or straining.  May watch TV  For 24 hours:  No driving, legal decisions, or alcoholic beverages  Safety precautions  Eat anything you prefer: It is better to start with liquids, then soup then solid foods.  _____ Eye patch should be worn until postoperative exam tomorrow.  ____ Solar shield eyeglasses should be worn for comfort in the sunlight/patch while sleeping  Resume all regular medications including aspirin or Coumadin if these were discontinued prior to surgery. You may shower, bathe, shave, or wash your hair. Tylenol may be taken for mild discomfort.  Call your doctor if you experience significant pain, nausea, or vomiting, fever > 101 or other signs of infection. [phone] or [phone] Specific instructions:  Follow-up Information    Abdias Hickam, MD Follow up.   Specialty:  Ophthalmology Why:  November 30 at 10:45am Contact information: [address]   [place] [address] [phone]

## 2016-08-31 NOTE — Interval H&P Note (Signed)
History and Physical Interval Note:  08/31/2016 7:29 AM  [name]  has presented today for surgery, with the diagnosis of Nuclear Sclerotic Cataract Left Eye  The various methods of treatment have been discussed with the patient and family. After consideration of risks, benefits and other options for treatment, the patient has consented to  Procedure(s) with comments: CATARACT EXTRACTION PHACO AND INTRAOCULAR LENS PLACEMENT ([facility]) (Left) - [place] AP% CDE Fluid pack lot # [id number] as a surgical intervention .  The patient's history has been reviewed, patient examined, no change in status, stable for surgery.  I have reviewed the patient's chart and labs.  Questions were answered to the patient's satisfaction.     Melissa Singh

## 2016-08-31 NOTE — OR Nursing (Signed)
IV discontinued. Site clear. No edema or redness.

## 2016-09-12 ENCOUNTER — Encounter: Payer: Medicare Other | Admitting: Family Medicine

## 2016-09-28 ENCOUNTER — Other Ambulatory Visit: Payer: Medicare Other

## 2016-09-28 DIAGNOSIS — R946 Abnormal results of thyroid function studies: Secondary | ICD-10-CM | POA: Diagnosis not present

## 2016-09-28 DIAGNOSIS — R748 Abnormal levels of other serum enzymes: Secondary | ICD-10-CM

## 2016-09-28 DIAGNOSIS — R7989 Other specified abnormal findings of blood chemistry: Secondary | ICD-10-CM

## 2016-09-29 ENCOUNTER — Telehealth: Payer: Self-pay | Admitting: Family Medicine

## 2016-09-29 LAB — COMPREHENSIVE METABOLIC PANEL
A/G RATIO: 1.3 (ref 1.2–2.2)
ALK PHOS: 127 IU/L — AB (ref 39–117)
ALT: 14 IU/L (ref 0–32)
AST: 11 IU/L (ref 0–40)
Albumin: 3.7 g/dL (ref 3.6–4.8)
BILIRUBIN TOTAL: 0.4 mg/dL (ref 0.0–1.2)
BUN/Creatinine Ratio: 18 (ref 12–28)
BUN: 22 mg/dL (ref 8–27)
CHLORIDE: 105 mmol/L (ref 96–106)
CO2: 23 mmol/L (ref 18–29)
Calcium: 10.6 mg/dL — ABNORMAL HIGH (ref 8.7–10.3)
Creatinine, Ser: 1.25 mg/dL — ABNORMAL HIGH (ref 0.57–1.00)
GFR calc Af Amer: 51 mL/min/{1.73_m2} — ABNORMAL LOW (ref >59)
GFR calc non Af Amer: 45 mL/min/{1.73_m2} — ABNORMAL LOW (ref >59)
GLUCOSE: 136 mg/dL — AB (ref 65–99)
Globulin, Total: 2.9 g/dL (ref 1.5–4.5)
POTASSIUM: 5 mmol/L (ref 3.5–5.2)
Sodium: 141 mmol/L (ref 134–144)
Total Protein: 6.6 g/dL (ref 6.0–8.5)

## 2016-09-29 LAB — TSH: TSH: 5.18 u[IU]/mL — AB (ref 0.450–4.500)

## 2016-09-29 NOTE — Telephone Encounter (Signed)
Please call pt and let her know that her labs are pretty stable from previous. Thyroid is just a tiny bit sluggish, but if she is not having any symptoms we absolutely don't have to start any medicine. Let's just plan to recheck at her 6 month follow up

## 2016-09-29 NOTE — Telephone Encounter (Signed)
Left message to call.

## 2016-09-29 NOTE — Telephone Encounter (Signed)
Patient notified

## 2016-10-17 DIAGNOSIS — B373 Candidiasis of vulva and vagina: Secondary | ICD-10-CM | POA: Diagnosis not present

## 2016-10-17 DIAGNOSIS — J310 Chronic rhinitis: Secondary | ICD-10-CM | POA: Diagnosis not present

## 2016-10-17 DIAGNOSIS — E1165 Type 2 diabetes mellitus with hyperglycemia: Secondary | ICD-10-CM | POA: Diagnosis not present

## 2016-10-17 DIAGNOSIS — E039 Hypothyroidism, unspecified: Secondary | ICD-10-CM | POA: Diagnosis not present

## 2016-10-17 DIAGNOSIS — Z794 Long term (current) use of insulin: Secondary | ICD-10-CM | POA: Diagnosis not present

## 2016-10-17 DIAGNOSIS — Z6841 Body Mass Index (BMI) 40.0 and over, adult: Secondary | ICD-10-CM | POA: Diagnosis not present

## 2016-10-17 DIAGNOSIS — J4520 Mild intermittent asthma, uncomplicated: Secondary | ICD-10-CM | POA: Diagnosis not present

## 2016-10-22 ENCOUNTER — Other Ambulatory Visit: Payer: Self-pay | Admitting: Family Medicine

## 2016-10-22 DIAGNOSIS — E1122 Type 2 diabetes mellitus with diabetic chronic kidney disease: Secondary | ICD-10-CM

## 2016-10-22 DIAGNOSIS — Z794 Long term (current) use of insulin: Principal | ICD-10-CM

## 2016-10-22 DIAGNOSIS — N182 Chronic kidney disease, stage 2 (mild): Principal | ICD-10-CM

## 2016-10-24 NOTE — Telephone Encounter (Signed)
Last OV: 08/30/16 Next OV: 02/28/17   Lab Results  Component Value Date   HGBA1C 7.3 05/09/2016

## 2016-11-14 ENCOUNTER — Ambulatory Visit: Payer: Medicare Other | Admitting: Dietician

## 2016-11-17 ENCOUNTER — Other Ambulatory Visit: Payer: Self-pay | Admitting: Family Medicine

## 2016-11-17 NOTE — Telephone Encounter (Signed)
Last OV: 08/30/16 Next OV: 02/28/17  BMP Latest Ref Rng & Units 09/28/2016 08/30/2016 02/03/2016  Glucose 65 - 99 mg/dL 136(H) 129(H) 116(H)  BUN 8 - 27 mg/dL 22 24 33(H)  Creatinine 0.57 - 1.00 mg/dL 1.25(H) 1.16(H) 1.25(H)  BUN/Creat Ratio 12 - 28 18 21 26   Sodium 134 - 144 mmol/L 141 140 140  Potassium 3.5 - 5.2 mmol/L 5.0 5.0 5.1  Chloride 96 - 106 mmol/L 105 100 104  CO2 18 - 29 mmol/L 23 20 22   Calcium 8.7 - 10.3 mg/dL 10.6(H) 10.5(H) 10.2    Lab Results  Component Value Date   CHOL 186 08/30/2016   HDL 55 08/30/2016   LDLCALC 72 08/30/2016   TRIG 294 (H) 08/30/2016   CHOLHDL 3.4 08/30/2016   Lab Results  Component Value Date   CREATININE 1.25 (H) 09/28/2016   BUN 22 09/28/2016   NA 141 09/28/2016   K 5.0 09/28/2016   CL 105 09/28/2016   CO2 23 09/28/2016

## 2016-11-21 ENCOUNTER — Encounter: Payer: Medicare Other | Attending: Internal Medicine | Admitting: Dietician

## 2016-11-21 ENCOUNTER — Encounter: Payer: Self-pay | Admitting: Dietician

## 2016-11-21 VITALS — BP 128/76 | Ht 64.0 in | Wt 263.1 lb

## 2016-11-21 DIAGNOSIS — E1165 Type 2 diabetes mellitus with hyperglycemia: Secondary | ICD-10-CM | POA: Diagnosis not present

## 2016-11-21 DIAGNOSIS — Z713 Dietary counseling and surveillance: Secondary | ICD-10-CM | POA: Insufficient documentation

## 2016-11-21 DIAGNOSIS — E039 Hypothyroidism, unspecified: Secondary | ICD-10-CM | POA: Diagnosis not present

## 2016-11-21 DIAGNOSIS — Z794 Long term (current) use of insulin: Secondary | ICD-10-CM | POA: Diagnosis not present

## 2016-11-21 DIAGNOSIS — E119 Type 2 diabetes mellitus without complications: Secondary | ICD-10-CM | POA: Insufficient documentation

## 2016-11-21 NOTE — Patient Instructions (Signed)
  Check blood sugars 4 x day before each meal and before bed every day and record  Exercise: Try exercises on exercise DVD and walk extra steps at work  Avoid sugar sweetened drinks (soda, tea, coffee, sports drinks, juices)  Limit intake of sweets, fried foods and snack foods  Make healthy food choices  Eat 3 meals day,  3  snacks a day  Eat 2-3 carbohydrate servings/meal + protein  Eat 1 carbohydrate serving/snack + protein  Space meals 4-5 hours apart  Complete 3 Day Food Record and bring to next appt  Bring blood sugar records to the next appointment/class  Get a Sharps container  Check feet daily  Carry fast acting glucose and a snack at all times  Rotate injection sites  Take Novolog before meals and eat meal in 15 min.   Carry medical alert ID at all times  Return for appointment/classes on:  12-12-16

## 2016-11-21 NOTE — Progress Notes (Signed)
Diabetes Self-Management Education  Visit Type: First/Initial  Appt. Start Time:1030 Appt. End Time: [id number]  11/21/2016  [name], identified by name and date of birth, is a 66 y.o. female with a diagnosis of Diabetes: Type 2.   ASSESSMENT  Blood pressure 128/76, height 5\' 4"  (1.626 m), weight 263 lb 1.6 oz (119.3 kg). Body mass index is 45.16 kg/m.      Diabetes Self-Management Education - 11/21/16 1311      Visit Information   Visit Type First/Initial     Initial Visit   Diabetes Type Type 2     Health Coping   How would you rate your overall health? Fair     Psychosocial Assessment   Patient Belief/Attitude about Diabetes Motivated to manage diabetes   Self-care barriers None   Patient Concerns Glycemic Control   Special Needs None   Preferred Learning Style Auditory;Hands on;Visual   Learning Readiness Ready   What is the last grade level you completed in school? 12     Pre-Education Assessment   Patient understands the diabetes disease and treatment process. Needs Review   Patient understands incorporating nutritional management into lifestyle. Needs Review   Patient undertands incorporating physical activity into lifestyle. Needs Review   Patient understands using medications safely. Needs Review   Patient understands monitoring blood glucose, interpreting and using results Needs Review   Patient understands prevention, detection, and treatment of acute complications. Needs Review   Patient understands prevention, detection, and treatment of chronic complications. Needs Review   Patient understands how to develop strategies to address psychosocial issues. Needs Review   Patient understands how to develop strategies to promote health/change behavior. Needs Review     Complications   How often do you check your blood sugar? 3-4 times / week   Fasting Blood glucose range (mg/dL) 70-129;130-179;180-200;>200  ac supper 130's-200's; bedtime 120's-occasional  190's   Have you had a dilated eye exam in the past 12 months? Yes  09-2016   Have you had a dental exam in the past 12 months? Yes  05-2016   Are you checking your feet? Yes   How many days per week are you checking your feet? 3     Dietary Intake   Breakfast --  eats breakfast at [employer] (morning) --  eats nabs/cookies or snack food at 10:30a   Lunch --  eats lunch at 12:30-1p=eats fried foods and sweets 2-3x/wk.   Dinner --  eats supper at [employer] (evening) --  eats cheese or pnut butter and crackers at 10p   Beverage(s) --  drinks fruit juice x1/day, water and sugar free drinks 2-3xday; milk 1x/day     Exercise   Exercise Type ADL's     Patient Education   Previous Diabetes Education Yes (please comment)  when diagnosed 16 years ago   Disease state  Definition of diabetes, type 1 and 2, and the diagnosis of diabetes;Explored patient's options for treatment of their diabetes   Nutrition management  Role of diet in the treatment of diabetes and the relationship between the three main macronutrients and blood glucose level;Food label reading, portion sizes and measuring food.;Carbohydrate counting   Physical activity and exercise  Role of exercise on diabetes management, blood pressure control and cardiac health.;Helped patient identify appropriate exercises in relation to his/her diabetes, diabetes complications and other health issue.   Medications Taught/reviewed insulin injection, site rotation, insulin storage and needle disposal.;Reviewed patients medication for diabetes, action,  purpose, timing of dose and side effects.;Reviewed medication adjustment guidelines for hyperglycemia and sick days.   Monitoring Purpose and frequency of SMBG.;Taught/discussed recording of test results and interpretation of SMBG.;Identified appropriate SMBG and/or A1C goals.;Yearly dilated eye exam   Acute complications Taught treatment of hypoglycemia - the 15 rule.;Discussed and  identified patients' treatment of hyperglycemia.   Chronic complications Relationship between chronic complications and blood glucose control;Assessed and discussed foot care and prevention of foot problems;Lipid levels, blood glucose control and heart disease;Dental care;Retinopathy and reason for yearly dilated eye exams;Nephropathy, what it is, prevention of, the use of ACE, ARB's and early detection of through urine microalbumia.;Reviewed with patient heart disease, higher risk of, and prevention;Applicable immunizations   Psychosocial adjustment Role of stress on diabetes;Identified and addressed patients feelings and concerns about diabetes   Personal strategies to promote health Lifestyle issues that need to be addressed for better diabetes care;Helped patient develop diabetes management plan for (enter comment)     Outcomes   Expected Outcomes Demonstrated interest in learning. Expect positive outcomes      Individualized Plan for Diabetes Self-Management Training:   Learning Objective:  Patient will have a greater understanding of diabetes self-management. Patient education plan is to attend individual and/or group sessions per assessed needs and concerns.   Plan:   Patient Instructions   Check blood sugars 4 x day before each meal and before bed every day and record  Exercise: Try exercises on exercise DVD and walk extra steps at work  Avoid sugar sweetened drinks (soda, tea, coffee, sports drinks, juices)  Limit intake of sweets, fried foods and snack foods  Make healthy food choices  Eat 3 meals day,  3  snacks a day  Eat 2-3 carbohydrate servings/meal + protein  Eat 1 carbohydrate serving/snack + protein  Space meals 4-5 hours apart  Complete 3 Day Food Record and bring to next appt  Bring blood sugar records to the next appointment/class  Get a Sharps container  Check feet daily  Carry fast acting glucose and a snack at all times  Rotate injection  sites  Take Novolog before meals and eat meal in 15 min.   Carry medical alert ID at all times  Return for appointment/classes on:  12-12-16   Expected Outcomes:  Demonstrated interest in learning. Expect positive outcomes  Education material provided:Living Well With diabetes booklet, A1C handout, Foot care handout, General meal planning Guidelines, Exercise DVD and written handout, low/high  BG handouts  If problems or questions, patient to contact team via:  [phone]  Future DSME appointment:  12-12-16

## 2016-11-28 ENCOUNTER — Telehealth: Payer: Self-pay | Admitting: Family Medicine

## 2016-11-28 NOTE — Telephone Encounter (Signed)
[facility] do have refills on file. Will prepare to send out.

## 2016-11-28 NOTE — Telephone Encounter (Signed)
Patient needs refill on Torvastatin 20mg  once daily 90 supply  Thanks

## 2016-12-01 ENCOUNTER — Other Ambulatory Visit: Payer: Self-pay | Admitting: Family Medicine

## 2016-12-01 NOTE — Telephone Encounter (Signed)
Last (acute) OV: 08/30/16 Last routine OV: 08/30/16 Next OV: None on file.

## 2016-12-11 ENCOUNTER — Other Ambulatory Visit: Payer: Self-pay | Admitting: Family Medicine

## 2016-12-11 DIAGNOSIS — I10 Essential (primary) hypertension: Secondary | ICD-10-CM

## 2016-12-12 ENCOUNTER — Ambulatory Visit: Payer: Medicare Other | Admitting: Dietician

## 2016-12-12 NOTE — Telephone Encounter (Signed)
Last OV: 08/30/16 Next OV: 02/28/17  BMP Latest Ref Rng & Units 09/28/2016 08/30/2016 02/03/2016  Glucose 65 - 99 mg/dL 136(H) 129(H) 116(H)  BUN 8 - 27 mg/dL 22 24 33(H)  Creatinine 0.57 - 1.00 mg/dL 1.25(H) 1.16(H) 1.25(H)  BUN/Creat Ratio 12 - 28 18 21 26   Sodium 134 - 144 mmol/L 141 140 140  Potassium 3.5 - 5.2 mmol/L 5.0 5.0 5.1  Chloride 96 - 106 mmol/L 105 100 104  CO2 18 - 29 mmol/L 23 20 22   Calcium 8.7 - 10.3 mg/dL 10.6(H) 10.5(H) 10.2

## 2016-12-26 DIAGNOSIS — E1165 Type 2 diabetes mellitus with hyperglycemia: Secondary | ICD-10-CM | POA: Diagnosis not present

## 2016-12-26 DIAGNOSIS — Z794 Long term (current) use of insulin: Secondary | ICD-10-CM | POA: Diagnosis not present

## 2017-01-02 ENCOUNTER — Encounter: Payer: Medicare Other | Attending: Internal Medicine | Admitting: Dietician

## 2017-01-02 ENCOUNTER — Encounter: Payer: Self-pay | Admitting: Dietician

## 2017-01-02 VITALS — BP 128/78 | Ht 64.0 in | Wt 262.2 lb

## 2017-01-02 DIAGNOSIS — Z713 Dietary counseling and surveillance: Secondary | ICD-10-CM | POA: Diagnosis not present

## 2017-01-02 DIAGNOSIS — E119 Type 2 diabetes mellitus without complications: Secondary | ICD-10-CM | POA: Insufficient documentation

## 2017-01-02 DIAGNOSIS — Z794 Long term (current) use of insulin: Secondary | ICD-10-CM

## 2017-01-02 NOTE — Patient Instructions (Signed)
Continue with consistent meals spaced 4-5 hours apart. If greater than 6 hours, include a small snack consisting of 1 serving of carbohydrate and 1 oz. Protein. Balance meals with 2-3 oz protein, 2-4 servings of carbohydrate (averaging 3) and non-starchy vegetables. Avoid fruit with cereal and milk. Consider adding nuts for a protein at breakfast. Add a vegetables such as carrots to packed lunch. Consider arm chair exercises while watching TV.

## 2017-01-02 NOTE — Progress Notes (Signed)
Diabetes Self-Management Education  Visit Type:  Follow-up  Appt. Start Time: 1600 Appt. End Time: 1700  01/02/2017  [name], identified by name and date of birth, is a 66 y.o. female with a diagnosis of Diabetes:  Type 2 Diabetes.   ASSESSMENT  Blood pressure 128/78, height 5\' 4"  (1.626 m), weight 262 lb 3.2 oz (118.9 kg). Body mass index is 45.01 kg/m.       Diabetes Self-Management Education - 02/77/41 2878      Complications   Last HgB A1C per patient/outside source 7.2 %   How often do you check your blood sugar? 3-4 times/day   Fasting Blood glucose range (mg/dL) 130-179;180-200   Postprandial Blood glucose range (mg/dL) 130-179   Have you had a dilated eye exam in the past 12 months? Yes   Have you had a dental exam in the past 12 months? Yes   Are you checking your feet? Yes   How many days per week are you checking your feet? 4     Dietary Intake   Breakfast cereal with banana/milk or 2 eggs with English muffin   Snack (morning) 6 pack of peanut butter/crackers   Lunch 12:30pm- mini-sub from [place] [employer], green tea or cottage cheese with pineapple, cutie or 1/2 ham, swiss cheese sandwich with 4 oz yogurt, cutie   Snack (afternoon) 1/4- 1/3 cup cashews   Dinner 7:00pm0 chicken strips/salad, green tea or roast beef/potatoes/carrots, 1 slice bread, applesauce,   Snack (evening) 2 cheese sticks or banana with peanut butter   Beverage(s) water, 2-3 green tea drinks (no sugar), 1 cup milk daily,      Exercise   Exercise Type ADL's     Patient Education   Nutrition management  Role of diet in the treatment of diabetes and the relationship between the three main macronutrients and blood glucose level;Food label reading, portion sizes and measuring food.;Carbohydrate counting;Meal options for control of blood glucose level and chronic complications.;Reviewed blood glucose goals for pre and post meals and how to evaluate the patients' food intake on their blood glucose  level.   Physical activity and exercise  Role of exercise on diabetes management, blood pressure control and cardiac health.;Helped patient identify appropriate exercises in relation to his/her diabetes, diabetes complications and other health issue.     Individualized Goals (developed by patient)   Nutrition Follow meal plan discussed   Physical Activity Exercise 3-5 times per week     Post-Education Assessment   Patient understands the diabetes disease and treatment process. Demonstrates understanding / competency   Patient understands incorporating nutritional management into lifestyle. Demonstrates understanding / competency   Patient undertands incorporating physical activity into lifestyle. Demonstrates understanding / competency   Patient understands using medications safely. Demonstrates understanding / competency   Patient understands monitoring blood glucose, interpreting and using results Demonstrates understanding / competency   Patient understands prevention, detection, and treatment of acute complications. Demonstrates understanding / competency   Patient understands prevention, detection, and treatment of chronic complications. Demonstrates understanding / competency   Patient understands how to develop strategies to address psychosocial issues. Demonstrates understanding / competency   Patient understands how to develop strategies to promote health/change behavior. Demonstrates understanding / competency     Outcomes   Program Status Completed      Learning Objective:  Patient will have a greater understanding of diabetes self-management. Patient education plan is to attend individual and/or group sessions per assessed needs and concerns.   Plan:  Patient Instructions  Continue with consistent meals spaced 4-5 hours apart. If greater than 6 hours, include a small snack consisting of 1 serving of carbohydrate and 1 oz. Protein. Balance meals with 2-3 oz protein, 2-4  servings of carbohydrate (averaging 3) and non-starchy vegetables. Avoid fruit with cereal and milk. Consider adding nuts for a protein at breakfast. Add a vegetables such as carrots to packed lunch. Consider arm chair exercises while watching TV.      Expected Outcomes:  Demonstrated interest in learning. Expect positive outcomes  Education material provided: Planning a Balanced Meal  If problems or questions, patient to contact team via:  Karolee Stamps, [place], [place]  Future DSME appointment: - PRN

## 2017-01-04 DIAGNOSIS — E1165 Type 2 diabetes mellitus with hyperglycemia: Secondary | ICD-10-CM | POA: Diagnosis not present

## 2017-01-04 DIAGNOSIS — E039 Hypothyroidism, unspecified: Secondary | ICD-10-CM | POA: Diagnosis not present

## 2017-01-04 DIAGNOSIS — Z794 Long term (current) use of insulin: Secondary | ICD-10-CM | POA: Diagnosis not present

## 2017-01-20 ENCOUNTER — Other Ambulatory Visit: Payer: Self-pay | Admitting: Family Medicine

## 2017-01-20 DIAGNOSIS — N182 Chronic kidney disease, stage 2 (mild): Principal | ICD-10-CM

## 2017-01-20 DIAGNOSIS — Z794 Long term (current) use of insulin: Principal | ICD-10-CM

## 2017-01-20 DIAGNOSIS — I10 Essential (primary) hypertension: Secondary | ICD-10-CM

## 2017-01-20 DIAGNOSIS — E1122 Type 2 diabetes mellitus with diabetic chronic kidney disease: Secondary | ICD-10-CM

## 2017-01-20 NOTE — Telephone Encounter (Signed)
  Last routine OV: 08/30/16 Next OV: 02/28/17

## 2017-02-13 DIAGNOSIS — E119 Type 2 diabetes mellitus without complications: Secondary | ICD-10-CM | POA: Diagnosis not present

## 2017-02-13 DIAGNOSIS — E782 Mixed hyperlipidemia: Secondary | ICD-10-CM | POA: Diagnosis not present

## 2017-02-13 DIAGNOSIS — I340 Nonrheumatic mitral (valve) insufficiency: Secondary | ICD-10-CM | POA: Diagnosis not present

## 2017-02-13 DIAGNOSIS — I6523 Occlusion and stenosis of bilateral carotid arteries: Secondary | ICD-10-CM | POA: Diagnosis not present

## 2017-02-13 DIAGNOSIS — R079 Chest pain, unspecified: Secondary | ICD-10-CM | POA: Diagnosis not present

## 2017-02-13 DIAGNOSIS — I10 Essential (primary) hypertension: Secondary | ICD-10-CM | POA: Diagnosis not present

## 2017-02-28 ENCOUNTER — Ambulatory Visit (INDEPENDENT_AMBULATORY_CARE_PROVIDER_SITE_OTHER): Payer: Medicare Other | Admitting: Family Medicine

## 2017-02-28 ENCOUNTER — Encounter: Payer: Self-pay | Admitting: Family Medicine

## 2017-02-28 VITALS — BP 132/82 | HR 80 | Wt 264.2 lb

## 2017-02-28 DIAGNOSIS — E7800 Pure hypercholesterolemia, unspecified: Secondary | ICD-10-CM | POA: Diagnosis not present

## 2017-02-28 DIAGNOSIS — I10 Essential (primary) hypertension: Secondary | ICD-10-CM | POA: Diagnosis not present

## 2017-02-28 DIAGNOSIS — N182 Chronic kidney disease, stage 2 (mild): Secondary | ICD-10-CM

## 2017-02-28 DIAGNOSIS — E1122 Type 2 diabetes mellitus with diabetic chronic kidney disease: Secondary | ICD-10-CM | POA: Diagnosis not present

## 2017-02-28 DIAGNOSIS — Z794 Long term (current) use of insulin: Secondary | ICD-10-CM

## 2017-02-28 LAB — LP+ALT+AST PICCOLO, WAIVED
ALT (SGPT) PICCOLO, WAIVED: 21 U/L (ref 10–47)
AST (SGOT) PICCOLO, WAIVED: 23 U/L (ref 11–38)
CHOL/HDL RATIO PICCOLO,WAIVE: 3.1 mg/dL
CHOLESTEROL PICCOLO, WAIVED: 176 mg/dL (ref <200)
HDL Chol Piccolo, Waived: 56 mg/dL — ABNORMAL LOW (ref >59)
LDL Chol Calc Piccolo Waived: 45 mg/dL (ref <100)
Triglycerides Piccolo,Waived: 375 mg/dL — ABNORMAL HIGH (ref <150)
VLDL Chol Calc Piccolo,Waive: 75 mg/dL — ABNORMAL HIGH (ref <30)

## 2017-02-28 NOTE — Assessment & Plan Note (Signed)
Followed by endocrinology 

## 2017-02-28 NOTE — Assessment & Plan Note (Signed)
The current medical regimen is effective;  continue present plan and medications.  

## 2017-02-28 NOTE — Progress Notes (Signed)
BP 132/82   Pulse 80   Wt 264 lb 3.2 oz (119.8 kg)   SpO2 95%   BMI 45.35 kg/m    Subjective:    Patient ID: Melissa Singh, female    DOB: 1951-01-25, 66 y.o.   MRN: [id number]  HPI: Melissa Singh is a 66 y.o. female  Chief Complaint  Patient presents with  . Follow-up  . Diabetes  Diabetes followed by endocrinology and doing fair at best. Blood pressure taking medications without problems doing okay no issues takes faithfully. Cholesterol taking Lipitor without problems and doing well with no complaints  Relevant past medical, surgical, family and social history reviewed and updated as indicated. Interim medical history since our last visit reviewed. Allergies and medications reviewed and updated.  Review of Systems  Constitutional: Negative.   Respiratory: Negative.   Cardiovascular: Negative.     Per HPI unless specifically indicated above     Objective:    BP 132/82   Pulse 80   Wt 264 lb 3.2 oz (119.8 kg)   SpO2 95%   BMI 45.35 kg/m   Wt Readings from Last 3 Encounters:  02/28/17 264 lb 3.2 oz (119.8 kg)  01/02/17 262 lb 3.2 oz (118.9 kg)  11/21/16 263 lb 1.6 oz (119.3 kg)    Physical Exam  Constitutional: She is oriented to person, place, and time. She appears well-developed and well-nourished.  HENT:  Head: Normocephalic and atraumatic.  Eyes: Conjunctivae and EOM are normal.  Neck: Normal range of motion.  Cardiovascular: Normal rate, regular rhythm and normal heart sounds.   Pulmonary/Chest: Effort normal and breath sounds normal.  Musculoskeletal: Normal range of motion.  Neurological: She is alert and oriented to person, place, and time.  Skin: No erythema.  Psychiatric: She has a normal mood and affect. Her behavior is normal. Judgment and thought content normal.    Results for orders placed or performed in visit on 09/28/16  TSH  Result Value Ref Range   TSH 5.180 (H) 0.450 - 4.500 uIU/mL  Comprehensive metabolic panel  Result Value  Ref Range   Glucose 136 (H) 65 - 99 mg/dL   BUN 22 8 - 27 mg/dL   Creatinine, Ser 1.25 (H) 0.57 - 1.00 mg/dL   GFR calc non Af Amer 45 (L) >59 mL/min/1.73   GFR calc Af Amer 51 (L) >59 mL/min/1.73   BUN/Creatinine Ratio 18 12 - 28   Sodium 141 134 - 144 mmol/L   Potassium 5.0 3.5 - 5.2 mmol/L   Chloride 105 96 - 106 mmol/L   CO2 23 18 - 29 mmol/L   Calcium 10.6 (H) 8.7 - 10.3 mg/dL   Total Protein 6.6 6.0 - 8.5 g/dL   Albumin 3.7 3.6 - 4.8 g/dL   Globulin, Total 2.9 1.5 - 4.5 g/dL   Albumin/Globulin Ratio 1.3 1.2 - 2.2   Bilirubin Total 0.4 0.0 - 1.2 mg/dL   Alkaline Phosphatase 127 (H) 39 - 117 IU/L   AST 11 0 - 40 IU/L   ALT 14 0 - 32 IU/L      Assessment & Plan:   Problem List Items Addressed This Visit      Cardiovascular and Mediastinum   Essential hypertension - Primary    The current medical regimen is effective;  continue present plan and medications.       Relevant Orders   Basic metabolic panel   LP+ALT+AST Piccolo, [place]     Endocrine   Type II diabetes mellitus  with renal manifestations ([address])    Followed by endocrinology      Relevant Orders   Basic metabolic panel   LP+ALT+AST Piccolo, Waived     Other   Hypercholesteremia    The current medical regimen is effective;  continue present plan and medications.       Relevant Orders   Basic metabolic panel   LP+ALT+AST Piccolo, Waived       Follow up plan: Return in about 6 months (around 08/31/2017) for Physical Exam.

## 2017-03-01 ENCOUNTER — Encounter: Payer: Self-pay | Admitting: Family Medicine

## 2017-03-01 LAB — BASIC METABOLIC PANEL
BUN / CREAT RATIO: 18 (ref 12–28)
BUN: 21 mg/dL (ref 8–27)
CO2: 23 mmol/L (ref 18–29)
CREATININE: 1.18 mg/dL — AB (ref 0.57–1.00)
Calcium: 10.3 mg/dL (ref 8.7–10.3)
Chloride: 102 mmol/L (ref 96–106)
GFR calc Af Amer: 55 mL/min/{1.73_m2} — ABNORMAL LOW (ref >59)
GFR, EST NON AFRICAN AMERICAN: 48 mL/min/{1.73_m2} — AB (ref >59)
GLUCOSE: 260 mg/dL — AB (ref 65–99)
POTASSIUM: 5.4 mmol/L — AB (ref 3.5–5.2)
SODIUM: 137 mmol/L (ref 134–144)

## 2017-03-08 DIAGNOSIS — E1165 Type 2 diabetes mellitus with hyperglycemia: Secondary | ICD-10-CM | POA: Diagnosis not present

## 2017-03-08 DIAGNOSIS — Z794 Long term (current) use of insulin: Secondary | ICD-10-CM | POA: Diagnosis not present

## 2017-03-15 DIAGNOSIS — I10 Essential (primary) hypertension: Secondary | ICD-10-CM | POA: Diagnosis not present

## 2017-03-15 DIAGNOSIS — R079 Chest pain, unspecified: Secondary | ICD-10-CM | POA: Diagnosis not present

## 2017-03-15 DIAGNOSIS — I208 Other forms of angina pectoris: Secondary | ICD-10-CM | POA: Diagnosis not present

## 2017-03-15 DIAGNOSIS — E782 Mixed hyperlipidemia: Secondary | ICD-10-CM | POA: Diagnosis not present

## 2017-03-18 ENCOUNTER — Other Ambulatory Visit: Payer: Self-pay | Admitting: Family Medicine

## 2017-03-18 DIAGNOSIS — I10 Essential (primary) hypertension: Secondary | ICD-10-CM

## 2017-03-20 DIAGNOSIS — H1859 Other hereditary corneal dystrophies: Secondary | ICD-10-CM | POA: Diagnosis not present

## 2017-03-21 ENCOUNTER — Other Ambulatory Visit: Payer: Self-pay | Admitting: Family Medicine

## 2017-03-21 DIAGNOSIS — I10 Essential (primary) hypertension: Secondary | ICD-10-CM

## 2017-04-19 DIAGNOSIS — E782 Mixed hyperlipidemia: Secondary | ICD-10-CM | POA: Diagnosis not present

## 2017-04-19 DIAGNOSIS — I208 Other forms of angina pectoris: Secondary | ICD-10-CM | POA: Diagnosis not present

## 2017-04-19 DIAGNOSIS — I10 Essential (primary) hypertension: Secondary | ICD-10-CM | POA: Diagnosis not present

## 2017-06-16 DIAGNOSIS — E1165 Type 2 diabetes mellitus with hyperglycemia: Secondary | ICD-10-CM | POA: Diagnosis not present

## 2017-06-16 DIAGNOSIS — Z794 Long term (current) use of insulin: Secondary | ICD-10-CM | POA: Diagnosis not present

## 2017-06-22 DIAGNOSIS — E1165 Type 2 diabetes mellitus with hyperglycemia: Secondary | ICD-10-CM | POA: Diagnosis not present

## 2017-06-22 DIAGNOSIS — Z794 Long term (current) use of insulin: Secondary | ICD-10-CM | POA: Diagnosis not present

## 2017-06-22 DIAGNOSIS — I10 Essential (primary) hypertension: Secondary | ICD-10-CM | POA: Diagnosis not present

## 2017-06-22 DIAGNOSIS — J310 Chronic rhinitis: Secondary | ICD-10-CM | POA: Diagnosis not present

## 2017-06-22 DIAGNOSIS — J4520 Mild intermittent asthma, uncomplicated: Secondary | ICD-10-CM | POA: Diagnosis not present

## 2017-06-22 DIAGNOSIS — E042 Nontoxic multinodular goiter: Secondary | ICD-10-CM | POA: Diagnosis not present

## 2017-06-22 DIAGNOSIS — E1159 Type 2 diabetes mellitus with other circulatory complications: Secondary | ICD-10-CM | POA: Diagnosis not present

## 2017-06-22 DIAGNOSIS — R0602 Shortness of breath: Secondary | ICD-10-CM | POA: Diagnosis not present

## 2017-06-22 DIAGNOSIS — E1169 Type 2 diabetes mellitus with other specified complication: Secondary | ICD-10-CM | POA: Diagnosis not present

## 2017-06-22 DIAGNOSIS — E785 Hyperlipidemia, unspecified: Secondary | ICD-10-CM | POA: Diagnosis not present

## 2017-06-22 DIAGNOSIS — E559 Vitamin D deficiency, unspecified: Secondary | ICD-10-CM | POA: Diagnosis not present

## 2017-06-23 DIAGNOSIS — E1159 Type 2 diabetes mellitus with other circulatory complications: Secondary | ICD-10-CM | POA: Diagnosis not present

## 2017-06-23 DIAGNOSIS — E1169 Type 2 diabetes mellitus with other specified complication: Secondary | ICD-10-CM | POA: Diagnosis not present

## 2017-06-23 DIAGNOSIS — I6523 Occlusion and stenosis of bilateral carotid arteries: Secondary | ICD-10-CM | POA: Diagnosis not present

## 2017-06-23 DIAGNOSIS — E785 Hyperlipidemia, unspecified: Secondary | ICD-10-CM | POA: Diagnosis not present

## 2017-06-23 DIAGNOSIS — I25118 Atherosclerotic heart disease of native coronary artery with other forms of angina pectoris: Secondary | ICD-10-CM | POA: Diagnosis not present

## 2017-06-23 DIAGNOSIS — I10 Essential (primary) hypertension: Secondary | ICD-10-CM | POA: Diagnosis not present

## 2017-07-08 ENCOUNTER — Other Ambulatory Visit: Payer: Self-pay | Admitting: Family Medicine

## 2017-07-08 DIAGNOSIS — Z794 Long term (current) use of insulin: Principal | ICD-10-CM

## 2017-07-08 DIAGNOSIS — N182 Chronic kidney disease, stage 2 (mild): Principal | ICD-10-CM

## 2017-07-08 DIAGNOSIS — E1122 Type 2 diabetes mellitus with diabetic chronic kidney disease: Secondary | ICD-10-CM

## 2017-07-21 ENCOUNTER — Other Ambulatory Visit: Payer: Self-pay | Admitting: Family Medicine

## 2017-07-21 DIAGNOSIS — I10 Essential (primary) hypertension: Secondary | ICD-10-CM

## 2017-07-21 NOTE — Telephone Encounter (Signed)
Routing to provider. Appt on 09/18/17.

## 2017-07-24 DIAGNOSIS — Y33XXXA Other specified events, undetermined intent, initial encounter: Secondary | ICD-10-CM | POA: Diagnosis not present

## 2017-07-24 DIAGNOSIS — S93602A Unspecified sprain of left foot, initial encounter: Secondary | ICD-10-CM | POA: Diagnosis not present

## 2017-07-24 DIAGNOSIS — M85871 Other specified disorders of bone density and structure, right ankle and foot: Secondary | ICD-10-CM | POA: Diagnosis not present

## 2017-07-24 DIAGNOSIS — M7662 Achilles tendinitis, left leg: Secondary | ICD-10-CM | POA: Diagnosis not present

## 2017-08-02 DIAGNOSIS — M25572 Pain in left ankle and joints of left foot: Secondary | ICD-10-CM | POA: Diagnosis not present

## 2017-08-02 DIAGNOSIS — R2689 Other abnormalities of gait and mobility: Secondary | ICD-10-CM | POA: Diagnosis not present

## 2017-08-14 ENCOUNTER — Other Ambulatory Visit: Payer: Self-pay | Admitting: Family Medicine

## 2017-08-14 DIAGNOSIS — N182 Chronic kidney disease, stage 2 (mild): Secondary | ICD-10-CM

## 2017-08-14 DIAGNOSIS — E7800 Pure hypercholesterolemia, unspecified: Secondary | ICD-10-CM

## 2017-08-14 DIAGNOSIS — E1122 Type 2 diabetes mellitus with diabetic chronic kidney disease: Secondary | ICD-10-CM

## 2017-08-14 DIAGNOSIS — Z794 Long term (current) use of insulin: Secondary | ICD-10-CM

## 2017-08-14 NOTE — Telephone Encounter (Signed)
Your patient 

## 2017-08-15 DIAGNOSIS — M25572 Pain in left ankle and joints of left foot: Secondary | ICD-10-CM | POA: Diagnosis not present

## 2017-08-15 DIAGNOSIS — R2689 Other abnormalities of gait and mobility: Secondary | ICD-10-CM | POA: Diagnosis not present

## 2017-08-22 DIAGNOSIS — R2689 Other abnormalities of gait and mobility: Secondary | ICD-10-CM | POA: Diagnosis not present

## 2017-08-22 DIAGNOSIS — M25572 Pain in left ankle and joints of left foot: Secondary | ICD-10-CM | POA: Diagnosis not present

## 2017-08-28 DIAGNOSIS — R2689 Other abnormalities of gait and mobility: Secondary | ICD-10-CM | POA: Diagnosis not present

## 2017-08-28 DIAGNOSIS — M25572 Pain in left ankle and joints of left foot: Secondary | ICD-10-CM | POA: Diagnosis not present

## 2017-09-04 ENCOUNTER — Other Ambulatory Visit: Payer: Self-pay | Admitting: Family Medicine

## 2017-09-04 DIAGNOSIS — I10 Essential (primary) hypertension: Secondary | ICD-10-CM

## 2017-09-04 NOTE — Telephone Encounter (Signed)
Your patient 

## 2017-09-14 ENCOUNTER — Other Ambulatory Visit: Payer: Self-pay | Admitting: Family Medicine

## 2017-09-14 DIAGNOSIS — I10 Essential (primary) hypertension: Secondary | ICD-10-CM

## 2017-09-14 DIAGNOSIS — E7800 Pure hypercholesterolemia, unspecified: Secondary | ICD-10-CM

## 2017-09-15 ENCOUNTER — Other Ambulatory Visit: Payer: Self-pay | Admitting: Family Medicine

## 2017-09-15 DIAGNOSIS — Z794 Long term (current) use of insulin: Principal | ICD-10-CM

## 2017-09-15 DIAGNOSIS — N182 Chronic kidney disease, stage 2 (mild): Principal | ICD-10-CM

## 2017-09-15 DIAGNOSIS — E1122 Type 2 diabetes mellitus with diabetic chronic kidney disease: Secondary | ICD-10-CM

## 2017-09-18 ENCOUNTER — Encounter: Payer: Self-pay | Admitting: Family Medicine

## 2017-09-18 ENCOUNTER — Ambulatory Visit (INDEPENDENT_AMBULATORY_CARE_PROVIDER_SITE_OTHER): Payer: Medicare Other | Admitting: Family Medicine

## 2017-09-18 VITALS — BP 138/84 | HR 81 | Ht 64.0 in | Wt 253.0 lb

## 2017-09-18 DIAGNOSIS — E7800 Pure hypercholesterolemia, unspecified: Secondary | ICD-10-CM

## 2017-09-18 DIAGNOSIS — I129 Hypertensive chronic kidney disease with stage 1 through stage 4 chronic kidney disease, or unspecified chronic kidney disease: Secondary | ICD-10-CM | POA: Diagnosis not present

## 2017-09-18 DIAGNOSIS — Z1329 Encounter for screening for other suspected endocrine disorder: Secondary | ICD-10-CM

## 2017-09-18 DIAGNOSIS — N182 Chronic kidney disease, stage 2 (mild): Secondary | ICD-10-CM | POA: Diagnosis not present

## 2017-09-18 DIAGNOSIS — Z7189 Other specified counseling: Secondary | ICD-10-CM

## 2017-09-18 DIAGNOSIS — Z6841 Body Mass Index (BMI) 40.0 and over, adult: Secondary | ICD-10-CM | POA: Diagnosis not present

## 2017-09-18 DIAGNOSIS — L570 Actinic keratosis: Secondary | ICD-10-CM

## 2017-09-18 DIAGNOSIS — E1122 Type 2 diabetes mellitus with diabetic chronic kidney disease: Secondary | ICD-10-CM | POA: Diagnosis not present

## 2017-09-18 DIAGNOSIS — Z0000 Encounter for general adult medical examination without abnormal findings: Secondary | ICD-10-CM

## 2017-09-18 DIAGNOSIS — E119 Type 2 diabetes mellitus without complications: Secondary | ICD-10-CM | POA: Diagnosis not present

## 2017-09-18 DIAGNOSIS — J019 Acute sinusitis, unspecified: Secondary | ICD-10-CM | POA: Diagnosis not present

## 2017-09-18 DIAGNOSIS — Z794 Long term (current) use of insulin: Secondary | ICD-10-CM | POA: Diagnosis not present

## 2017-09-18 DIAGNOSIS — Z0001 Encounter for general adult medical examination with abnormal findings: Secondary | ICD-10-CM | POA: Diagnosis not present

## 2017-09-18 DIAGNOSIS — Z23 Encounter for immunization: Secondary | ICD-10-CM | POA: Diagnosis not present

## 2017-09-18 DIAGNOSIS — I10 Essential (primary) hypertension: Secondary | ICD-10-CM | POA: Diagnosis not present

## 2017-09-18 LAB — URINALYSIS, ROUTINE W REFLEX MICROSCOPIC
Bilirubin, UA: NEGATIVE
GLUCOSE, UA: NEGATIVE
KETONES UA: NEGATIVE
LEUKOCYTES UA: NEGATIVE
NITRITE UA: NEGATIVE
RBC, UA: NEGATIVE
SPEC GRAV UA: 1.025 (ref 1.005–1.030)
Urobilinogen, Ur: 0.2 mg/dL (ref 0.2–1.0)
pH, UA: 5.5 (ref 5.0–7.5)

## 2017-09-18 LAB — MICROSCOPIC EXAMINATION
Epithelial Cells (non renal): >10 /hpf — AB (ref 0–10)
RBC, UA: NONE SEEN /hpf (ref 0–?)

## 2017-09-18 LAB — BAYER DCA HB A1C WAIVED: HB A1C: 7.6 % — AB (ref <7.0)

## 2017-09-18 MED ORDER — MONTELUKAST SODIUM 10 MG PO TABS: 10.0000 mg | ORAL_TABLET | Freq: Every day | ORAL | 4 refills | Status: AC

## 2017-09-18 MED ORDER — BENZONATATE 100 MG PO CAPS: 100.0000 mg | ORAL_CAPSULE | Freq: Two times a day (BID) | ORAL | 0 refills | Status: DC | PRN

## 2017-09-18 MED ORDER — LIRAGLUTIDE 18 MG/3ML ~~LOC~~ SOPN: 1.2000 mg | PEN_INJECTOR | Freq: Every day | SUBCUTANEOUS | 4 refills | Status: AC

## 2017-09-18 MED ORDER — AMOXICILLIN-POT CLAVULANATE 875-125 MG PO TABS: 1.0000 | ORAL_TABLET | Freq: Two times a day (BID) | ORAL | 0 refills | Status: DC

## 2017-09-18 MED ORDER — LOSARTAN POTASSIUM 100 MG PO TABS: 100.0000 mg | ORAL_TABLET | Freq: Every day | ORAL | 4 refills | Status: DC

## 2017-09-18 MED ORDER — COLESEVELAM HCL 625 MG PO TABS: 1875.0000 mg | ORAL_TABLET | Freq: Two times a day (BID) | ORAL | 4 refills | Status: DC

## 2017-09-18 NOTE — Assessment & Plan Note (Signed)
The current medical regimen is effective;  continue present plan and medications.  

## 2017-09-18 NOTE — Assessment & Plan Note (Signed)
A voluntary discussion about advance care planning including the explanation and discussion of advance directives was extensively discussed  with the patient.  Explanation about the health care proxy and Living will was reviewed and packet with forms with explanation of how to fill them out was given.    

## 2017-09-18 NOTE — Progress Notes (Addendum)
BP 138/84 (BP Location: Left Arm)   Pulse 81   Ht 5\' 4"  (1.626 m)   Wt 253 lb (114.8 kg)   SpO2 97%   BMI 43.43 kg/m    Subjective:    Patient ID: Melissa Singh, female    DOB: 10-19-1950, 66 y.o.   MRN: [id number]  HPI: Melissa Singh is a 66 y.o. female  Chief Complaint  Patient presents with  . Annual Exam  . Cough  patient with complaints of cough generally productive several times a day with very heavy coughing spell associated with nasal drainage and some head cold type symptoms that been ongoing for more than a month but just not getting better. Cough is occasionally productive of some green sputum. With some head pressure congestion drainage just can't seem to get it cleared up. Also concerned about dermatology has some bumps around her neck. Patient otherwise all in all doing well noted low blood sugar spells or issues with diabetes. Blood pressure also doing well along with allergies and cholesterol. Followed by endocrinology for diabetes care.  Relevant past medical, surgical, family and social history reviewed and updated as indicated. Interim medical history since our last visit reviewed. Allergies and medications reviewed and updated.  Review of Systems  Constitutional: Positive for fatigue. Negative for chills, diaphoresis and fever.  HENT: Positive for congestion, rhinorrhea, sinus pressure, sinus pain and sore throat.   Eyes: Negative.   Respiratory: Positive for cough. Negative for choking, shortness of breath and wheezing.   Cardiovascular: Negative.   Gastrointestinal: Negative.   Endocrine: Negative.   Genitourinary: Negative.   Musculoskeletal: Negative.   Skin: Negative.   Allergic/Immunologic: Negative.   Neurological: Negative.   Hematological: Negative.   Psychiatric/Behavioral: Negative.     Per HPI unless specifically indicated above     Objective:    BP 138/84 (BP Location: Left Arm)   Pulse 81   Ht 5\' 4"  (1.626 m)   Wt 253 lb  (114.8 kg)   SpO2 97%   BMI 43.43 kg/m   Wt Readings from Last 3 Encounters:  09/18/17 253 lb (114.8 kg)  02/28/17 264 lb 3.2 oz (119.8 kg)  01/02/17 262 lb 3.2 oz (118.9 kg)    Physical Exam  Constitutional: She is oriented to person, place, and time. She appears well-developed and well-nourished.  HENT:  Head: Normocephalic and atraumatic.  Right Ear: External ear normal.  Left Ear: External ear normal.  Nose: Nose normal.  Mouth/Throat: Oropharynx is clear and moist.  Eyes: Conjunctivae and EOM are normal. Pupils are equal, round, and reactive to light.  Neck: Normal range of motion. Neck supple. Carotid bruit is not present.  Cardiovascular: Normal rate, regular rhythm and normal heart sounds.  No murmur heard. Pulmonary/Chest: Effort normal and breath sounds normal. She exhibits no mass. Right breast exhibits no mass, no skin change and no tenderness. Left breast exhibits no mass, no skin change and no tenderness. Breasts are symmetrical.  Abdominal: Soft. Bowel sounds are normal. There is no hepatosplenomegaly.  Musculoskeletal: Normal range of motion.  Neurological: She is alert and oriented to person, place, and time.  Skin: No rash noted.  several skin tags seborrheic keratosis around neck and back also some actinic keratosis on face  Psychiatric: She has a normal mood and affect. Her behavior is normal. Judgment and thought content normal.    Results for orders placed or performed in visit on 09/62/83  Basic metabolic panel  Result Value Ref Range  Glucose 260 (H) 65 - 99 mg/dL   BUN 21 8 - 27 mg/dL   Creatinine, Ser 1.18 (H) 0.57 - 1.00 mg/dL   GFR calc non Af Amer 48 (L) >59 mL/min/1.73   GFR calc Af Amer 55 (L) >59 mL/min/1.73   BUN/Creatinine Ratio 18 12 - 28   Sodium 137 134 - 144 mmol/L   Potassium 5.4 (H) 3.5 - 5.2 mmol/L   Chloride 102 96 - 106 mmol/L   CO2 23 18 - 29 mmol/L   Calcium 10.3 8.7 - 10.3 mg/dL  LP+ALT+AST Piccolo, Waived  Result Value Ref  Range   ALT (SGPT) Piccolo, Waived 21 10 - 47 U/L   AST (SGOT) Piccolo, Waived 23 11 - 38 U/L   Cholesterol Piccolo, Waived 176 <200 mg/dL   HDL Chol Piccolo, Waived 56 (L) >59 mg/dL   Triglycerides Piccolo,Waived 375 (H) <150 mg/dL   Chol/HDL Ratio Piccolo,Waive 3.1 mg/dL   LDL Chol Calc Piccolo Waived 45 <100 mg/dL   VLDL Chol Calc Piccolo,Waive 75 (H) <30 mg/dL      Assessment & Plan:   Problem List Items Addressed This Visit      Cardiovascular and Mediastinum   Essential hypertension - Primary    The current medical regimen is effective;  continue present plan and medications.       Relevant Medications   losartan (COZAAR) 100 MG tablet   colesevelam (WELCHOL) 625 MG tablet   Other Relevant Orders   CBC with Differential/Platelet   Comprehensive metabolic panel   TSH   Urinalysis, Routine w reflex microscopic     Endocrine   Diabetes mellitus (HCC)   Relevant Medications   losartan (COZAAR) 100 MG tablet   liraglutide (VICTOZA) 18 MG/3ML SOPN   colesevelam (WELCHOL) 625 MG tablet   Other Relevant Orders   Bayer DCA Hb A1c Waived   CBC with Differential/Platelet   Comprehensive metabolic panel   TSH   Urinalysis, Routine w reflex microscopic     Other   Body mass index 40.0-44.9, adult (HCC) (Chronic)    Discuss wtloss diet      Relevant Medications   liraglutide (VICTOZA) 18 MG/3ML SOPN   Hypercholesteremia   Relevant Medications   losartan (COZAAR) 100 MG tablet   colesevelam (WELCHOL) 625 MG tablet   Other Relevant Orders   CBC with Differential/Platelet   Comprehensive metabolic panel   Lipid panel   TSH   Advanced care planning/counseling discussion    A voluntary discussion about advance care planning including the explanation and discussion of advance directives was extensively discussed  with the patient.  Explanation about the health care proxy and Living will was reviewed and packet with forms with explanation of how to fill them out was  given.          Other Visit Diagnoses    Thyroid disorder screen       Relevant Orders   TSH   Needs flu shot       Relevant Orders   Flu vaccine HIGH DOSE PF (Fluzone High dose) (Completed)   PE (physical exam), annual       Acute sinusitis, recurrence not specified, unspecified location       Relevant Medications   amoxicillin-clavulanate (AUGMENTIN) 875-125 MG tablet   benzonatate (TESSALON) 100 MG capsule   Actinic keratosis       Relevant Orders   Ambulatory referral to Dermatology      Discussed sinusitis bronchitis care and treatment with use of  medications Mucinex Tylenol Tylenol sinus etc.  Follow up plan: Return in about 6 months (around 03/19/2018) for BMP,  Lipids, ALT, AST.

## 2017-09-18 NOTE — Assessment & Plan Note (Signed)
Discuss wtloss diet

## 2017-09-19 DIAGNOSIS — R2689 Other abnormalities of gait and mobility: Secondary | ICD-10-CM | POA: Diagnosis not present

## 2017-09-19 DIAGNOSIS — E119 Type 2 diabetes mellitus without complications: Secondary | ICD-10-CM | POA: Diagnosis not present

## 2017-09-19 DIAGNOSIS — M25572 Pain in left ankle and joints of left foot: Secondary | ICD-10-CM | POA: Diagnosis not present

## 2017-09-19 LAB — COMPREHENSIVE METABOLIC PANEL
ALBUMIN: 3.9 g/dL (ref 3.6–4.8)
ALK PHOS: 139 IU/L — AB (ref 39–117)
ALT: 17 IU/L (ref 0–32)
AST: 11 IU/L (ref 0–40)
Albumin/Globulin Ratio: 1.4 (ref 1.2–2.2)
BILIRUBIN TOTAL: 0.4 mg/dL (ref 0.0–1.2)
BUN / CREAT RATIO: 18 (ref 12–28)
BUN: 18 mg/dL (ref 8–27)
CO2: 21 mmol/L (ref 20–29)
Calcium: 10.2 mg/dL (ref 8.7–10.3)
Chloride: 104 mmol/L (ref 96–106)
Creatinine, Ser: 1.01 mg/dL — ABNORMAL HIGH (ref 0.57–1.00)
GFR calc Af Amer: 66 mL/min/{1.73_m2} (ref >59)
GFR calc non Af Amer: 57 mL/min/{1.73_m2} — ABNORMAL LOW (ref >59)
GLUCOSE: 163 mg/dL — AB (ref 65–99)
Globulin, Total: 2.8 g/dL (ref 1.5–4.5)
POTASSIUM: 5 mmol/L (ref 3.5–5.2)
SODIUM: 140 mmol/L (ref 134–144)
TOTAL PROTEIN: 6.7 g/dL (ref 6.0–8.5)

## 2017-09-19 LAB — CBC WITH DIFFERENTIAL/PLATELET
BASOS ABS: 0 10*3/uL (ref 0.0–0.2)
BASOS: 0 %
EOS (ABSOLUTE): 0.6 10*3/uL — ABNORMAL HIGH (ref 0.0–0.4)
Eos: 6 %
Hematocrit: 41.6 % (ref 34.0–46.6)
Hemoglobin: 13.4 g/dL (ref 11.1–15.9)
Immature Grans (Abs): 0 10*3/uL (ref 0.0–0.1)
Immature Granulocytes: 0 %
LYMPHS: 19 %
Lymphocytes Absolute: 1.8 10*3/uL (ref 0.7–3.1)
MCH: 26.9 pg (ref 26.6–33.0)
MCHC: 32.2 g/dL (ref 31.5–35.7)
MCV: 84 fL (ref 79–97)
Monocytes Absolute: 0.4 10*3/uL (ref 0.1–0.9)
Monocytes: 4 %
NEUTROS ABS: 6.4 10*3/uL (ref 1.4–7.0)
Neutrophils: 71 %
Platelets: 301 10*3/uL (ref 150–379)
RBC: 4.98 x10E6/uL (ref 3.77–5.28)
RDW: 15.2 % (ref 12.3–15.4)
WBC: 9.2 10*3/uL (ref 3.4–10.8)

## 2017-09-19 LAB — HM DIABETES EYE EXAM

## 2017-09-19 LAB — LIPID PANEL
CHOLESTEROL TOTAL: 172 mg/dL (ref 100–199)
Chol/HDL Ratio: 3.1 ratio (ref 0.0–4.4)
HDL: 56 mg/dL (ref >39)
LDL CALC: 55 mg/dL (ref 0–99)
TRIGLYCERIDES: 304 mg/dL — AB (ref 0–149)
VLDL Cholesterol Cal: 61 mg/dL — ABNORMAL HIGH (ref 5–40)

## 2017-09-19 LAB — TSH: TSH: 4.97 u[IU]/mL — ABNORMAL HIGH (ref 0.450–4.500)

## 2017-09-20 ENCOUNTER — Telehealth: Payer: Self-pay | Admitting: Family Medicine

## 2017-09-20 DIAGNOSIS — Z794 Long term (current) use of insulin: Secondary | ICD-10-CM

## 2017-09-20 DIAGNOSIS — I10 Essential (primary) hypertension: Secondary | ICD-10-CM

## 2017-09-20 DIAGNOSIS — E119 Type 2 diabetes mellitus without complications: Secondary | ICD-10-CM

## 2017-09-20 NOTE — Telephone Encounter (Signed)
Phone call Discussed with patient elevated TSH and alkaline phosphatase Patient scheduled come back in 6 months but will change to 3 months and check hemoglobin A1c, TSH,and alkaline phosphatase.

## 2017-09-21 DIAGNOSIS — M7662 Achilles tendinitis, left leg: Secondary | ICD-10-CM | POA: Diagnosis not present

## 2017-09-28 DIAGNOSIS — R2689 Other abnormalities of gait and mobility: Secondary | ICD-10-CM | POA: Diagnosis not present

## 2017-09-28 DIAGNOSIS — M25572 Pain in left ankle and joints of left foot: Secondary | ICD-10-CM | POA: Diagnosis not present

## 2017-10-16 DIAGNOSIS — E042 Nontoxic multinodular goiter: Secondary | ICD-10-CM | POA: Diagnosis not present

## 2017-10-16 DIAGNOSIS — E1169 Type 2 diabetes mellitus with other specified complication: Secondary | ICD-10-CM | POA: Diagnosis not present

## 2017-10-16 DIAGNOSIS — E785 Hyperlipidemia, unspecified: Secondary | ICD-10-CM | POA: Diagnosis not present

## 2017-10-16 DIAGNOSIS — I10 Essential (primary) hypertension: Secondary | ICD-10-CM | POA: Diagnosis not present

## 2017-10-16 DIAGNOSIS — E1159 Type 2 diabetes mellitus with other circulatory complications: Secondary | ICD-10-CM | POA: Diagnosis not present

## 2017-10-16 DIAGNOSIS — E1165 Type 2 diabetes mellitus with hyperglycemia: Secondary | ICD-10-CM | POA: Diagnosis not present

## 2017-10-16 DIAGNOSIS — Z794 Long term (current) use of insulin: Secondary | ICD-10-CM | POA: Diagnosis not present

## 2017-10-16 DIAGNOSIS — E559 Vitamin D deficiency, unspecified: Secondary | ICD-10-CM | POA: Diagnosis not present

## 2017-10-23 DIAGNOSIS — E1169 Type 2 diabetes mellitus with other specified complication: Secondary | ICD-10-CM | POA: Diagnosis not present

## 2017-10-23 DIAGNOSIS — I340 Nonrheumatic mitral (valve) insufficiency: Secondary | ICD-10-CM | POA: Diagnosis not present

## 2017-10-23 DIAGNOSIS — Z794 Long term (current) use of insulin: Secondary | ICD-10-CM | POA: Diagnosis not present

## 2017-10-23 DIAGNOSIS — I6523 Occlusion and stenosis of bilateral carotid arteries: Secondary | ICD-10-CM | POA: Diagnosis not present

## 2017-10-23 DIAGNOSIS — I25118 Atherosclerotic heart disease of native coronary artery with other forms of angina pectoris: Secondary | ICD-10-CM | POA: Diagnosis not present

## 2017-10-23 DIAGNOSIS — E785 Hyperlipidemia, unspecified: Secondary | ICD-10-CM | POA: Diagnosis not present

## 2017-10-23 DIAGNOSIS — E1165 Type 2 diabetes mellitus with hyperglycemia: Secondary | ICD-10-CM | POA: Diagnosis not present

## 2017-10-23 DIAGNOSIS — E1159 Type 2 diabetes mellitus with other circulatory complications: Secondary | ICD-10-CM | POA: Diagnosis not present

## 2017-10-23 DIAGNOSIS — I10 Essential (primary) hypertension: Secondary | ICD-10-CM | POA: Diagnosis not present

## 2017-10-23 DIAGNOSIS — R011 Cardiac murmur, unspecified: Secondary | ICD-10-CM | POA: Diagnosis not present

## 2017-10-25 DIAGNOSIS — E1169 Type 2 diabetes mellitus with other specified complication: Secondary | ICD-10-CM | POA: Diagnosis not present

## 2017-10-25 DIAGNOSIS — E039 Hypothyroidism, unspecified: Secondary | ICD-10-CM | POA: Diagnosis not present

## 2017-10-25 DIAGNOSIS — E042 Nontoxic multinodular goiter: Secondary | ICD-10-CM | POA: Diagnosis not present

## 2017-10-25 DIAGNOSIS — Z794 Long term (current) use of insulin: Secondary | ICD-10-CM | POA: Diagnosis not present

## 2017-10-25 DIAGNOSIS — E1165 Type 2 diabetes mellitus with hyperglycemia: Secondary | ICD-10-CM | POA: Diagnosis not present

## 2017-10-25 DIAGNOSIS — Z6841 Body Mass Index (BMI) 40.0 and over, adult: Secondary | ICD-10-CM | POA: Diagnosis not present

## 2017-10-25 DIAGNOSIS — E785 Hyperlipidemia, unspecified: Secondary | ICD-10-CM | POA: Diagnosis not present

## 2017-10-30 DIAGNOSIS — H26491 Other secondary cataract, right eye: Secondary | ICD-10-CM | POA: Diagnosis not present

## 2017-11-01 ENCOUNTER — Other Ambulatory Visit: Payer: Self-pay | Admitting: Family Medicine

## 2017-11-01 DIAGNOSIS — E7800 Pure hypercholesterolemia, unspecified: Secondary | ICD-10-CM

## 2017-11-09 ENCOUNTER — Other Ambulatory Visit: Payer: Self-pay | Admitting: Family Medicine

## 2017-11-09 DIAGNOSIS — E1122 Type 2 diabetes mellitus with diabetic chronic kidney disease: Secondary | ICD-10-CM

## 2017-11-09 DIAGNOSIS — Z794 Long term (current) use of insulin: Principal | ICD-10-CM

## 2017-11-09 DIAGNOSIS — N182 Chronic kidney disease, stage 2 (mild): Principal | ICD-10-CM

## 2017-11-09 NOTE — Telephone Encounter (Signed)
Your patient 

## 2017-11-09 NOTE — Telephone Encounter (Signed)
Actos refill request I don't see where she is on this medication.   Looks like it was discontinued on 10/24/17 by Dr. Wynetta Emery.

## 2017-11-16 DIAGNOSIS — L570 Actinic keratosis: Secondary | ICD-10-CM | POA: Diagnosis not present

## 2017-11-16 DIAGNOSIS — L918 Other hypertrophic disorders of the skin: Secondary | ICD-10-CM | POA: Diagnosis not present

## 2017-11-16 DIAGNOSIS — D229 Melanocytic nevi, unspecified: Secondary | ICD-10-CM | POA: Diagnosis not present

## 2017-11-16 DIAGNOSIS — D235 Other benign neoplasm of skin of trunk: Secondary | ICD-10-CM | POA: Diagnosis not present

## 2017-11-16 DIAGNOSIS — L309 Dermatitis, unspecified: Secondary | ICD-10-CM | POA: Diagnosis not present

## 2017-11-16 DIAGNOSIS — L905 Scar conditions and fibrosis of skin: Secondary | ICD-10-CM | POA: Diagnosis not present

## 2017-11-16 DIAGNOSIS — D485 Neoplasm of uncertain behavior of skin: Secondary | ICD-10-CM | POA: Diagnosis not present

## 2017-11-16 DIAGNOSIS — L821 Other seborrheic keratosis: Secondary | ICD-10-CM | POA: Diagnosis not present

## 2017-11-16 DIAGNOSIS — D1800 Hemangioma unspecified site: Secondary | ICD-10-CM | POA: Diagnosis not present

## 2017-11-16 DIAGNOSIS — L812 Freckles: Secondary | ICD-10-CM | POA: Diagnosis not present

## 2017-11-16 DIAGNOSIS — L820 Inflamed seborrheic keratosis: Secondary | ICD-10-CM | POA: Diagnosis not present

## 2017-11-16 DIAGNOSIS — L853 Xerosis cutis: Secondary | ICD-10-CM | POA: Diagnosis not present

## 2017-11-26 ENCOUNTER — Other Ambulatory Visit: Payer: Self-pay | Admitting: Family Medicine

## 2017-11-26 DIAGNOSIS — I10 Essential (primary) hypertension: Secondary | ICD-10-CM

## 2017-11-30 ENCOUNTER — Encounter: Payer: Self-pay | Admitting: Family Medicine

## 2017-12-25 DIAGNOSIS — R05 Cough: Secondary | ICD-10-CM | POA: Diagnosis not present

## 2017-12-25 DIAGNOSIS — J4520 Mild intermittent asthma, uncomplicated: Secondary | ICD-10-CM | POA: Diagnosis not present

## 2017-12-25 DIAGNOSIS — J310 Chronic rhinitis: Secondary | ICD-10-CM | POA: Diagnosis not present

## 2018-01-18 DIAGNOSIS — E042 Nontoxic multinodular goiter: Secondary | ICD-10-CM | POA: Diagnosis not present

## 2018-01-24 DIAGNOSIS — L905 Scar conditions and fibrosis of skin: Secondary | ICD-10-CM | POA: Diagnosis not present

## 2018-01-24 DIAGNOSIS — L820 Inflamed seborrheic keratosis: Secondary | ICD-10-CM | POA: Diagnosis not present

## 2018-01-24 DIAGNOSIS — Z794 Long term (current) use of insulin: Secondary | ICD-10-CM | POA: Diagnosis not present

## 2018-01-24 DIAGNOSIS — E039 Hypothyroidism, unspecified: Secondary | ICD-10-CM | POA: Diagnosis not present

## 2018-01-24 DIAGNOSIS — E1165 Type 2 diabetes mellitus with hyperglycemia: Secondary | ICD-10-CM | POA: Diagnosis not present

## 2018-01-24 DIAGNOSIS — L570 Actinic keratosis: Secondary | ICD-10-CM | POA: Diagnosis not present

## 2018-01-24 DIAGNOSIS — L821 Other seborrheic keratosis: Secondary | ICD-10-CM | POA: Diagnosis not present

## 2018-01-24 LAB — HEMOGLOBIN A1C: Hemoglobin A1C: 7.8

## 2018-01-29 DIAGNOSIS — H1859 Other hereditary corneal dystrophies: Secondary | ICD-10-CM | POA: Diagnosis not present

## 2018-01-31 DIAGNOSIS — E785 Hyperlipidemia, unspecified: Secondary | ICD-10-CM | POA: Diagnosis not present

## 2018-01-31 DIAGNOSIS — I10 Essential (primary) hypertension: Secondary | ICD-10-CM | POA: Diagnosis not present

## 2018-01-31 DIAGNOSIS — E042 Nontoxic multinodular goiter: Secondary | ICD-10-CM | POA: Diagnosis not present

## 2018-01-31 DIAGNOSIS — E1159 Type 2 diabetes mellitus with other circulatory complications: Secondary | ICD-10-CM | POA: Diagnosis not present

## 2018-01-31 DIAGNOSIS — E1165 Type 2 diabetes mellitus with hyperglycemia: Secondary | ICD-10-CM | POA: Diagnosis not present

## 2018-01-31 DIAGNOSIS — Z794 Long term (current) use of insulin: Secondary | ICD-10-CM | POA: Diagnosis not present

## 2018-01-31 DIAGNOSIS — E1169 Type 2 diabetes mellitus with other specified complication: Secondary | ICD-10-CM | POA: Diagnosis not present

## 2018-03-05 DIAGNOSIS — I10 Essential (primary) hypertension: Secondary | ICD-10-CM | POA: Diagnosis not present

## 2018-03-05 DIAGNOSIS — E1165 Type 2 diabetes mellitus with hyperglycemia: Secondary | ICD-10-CM | POA: Diagnosis not present

## 2018-03-05 DIAGNOSIS — E1159 Type 2 diabetes mellitus with other circulatory complications: Secondary | ICD-10-CM | POA: Diagnosis not present

## 2018-03-05 DIAGNOSIS — E1169 Type 2 diabetes mellitus with other specified complication: Secondary | ICD-10-CM | POA: Diagnosis not present

## 2018-03-05 DIAGNOSIS — I6523 Occlusion and stenosis of bilateral carotid arteries: Secondary | ICD-10-CM | POA: Diagnosis not present

## 2018-03-05 DIAGNOSIS — E785 Hyperlipidemia, unspecified: Secondary | ICD-10-CM | POA: Diagnosis not present

## 2018-03-05 DIAGNOSIS — Z794 Long term (current) use of insulin: Secondary | ICD-10-CM | POA: Diagnosis not present

## 2018-03-06 ENCOUNTER — Other Ambulatory Visit: Payer: Self-pay | Admitting: Family Medicine

## 2018-03-06 DIAGNOSIS — I10 Essential (primary) hypertension: Secondary | ICD-10-CM

## 2018-03-07 NOTE — Telephone Encounter (Signed)
Req. For Losartan refill; last refill 11/29/17; #90; no refills Last office visit:  09/18/17 PCP: Dr. Jeananne Rama Pharmacy: [facility]  Refilled Losartan per protocol.

## 2018-03-23 ENCOUNTER — Ambulatory Visit (INDEPENDENT_AMBULATORY_CARE_PROVIDER_SITE_OTHER): Payer: Medicare Other

## 2018-03-23 VITALS — BP 118/66 | HR 76 | Temp 97.6°F | Resp 16 | Ht 63.0 in | Wt 252.6 lb

## 2018-03-23 DIAGNOSIS — Z1239 Encounter for other screening for malignant neoplasm of breast: Secondary | ICD-10-CM

## 2018-03-23 DIAGNOSIS — Z0000 Encounter for general adult medical examination without abnormal findings: Secondary | ICD-10-CM

## 2018-03-23 DIAGNOSIS — Z1231 Encounter for screening mammogram for malignant neoplasm of breast: Secondary | ICD-10-CM | POA: Diagnosis not present

## 2018-03-23 NOTE — Progress Notes (Signed)
Subjective:   Melissa Singh is a 67 y.o. female who presents for Medicare Annual (Subsequent) preventive examination.  Review of Systems:   Cardiac Risk Factors include: advanced age (>37men, >56 women);hypertension;dyslipidemia;obesity (BMI >30kg/m2)     Objective:     Vitals: BP 118/66 (BP Location: Left Arm, Patient Position: Sitting)   Pulse 76   Temp 97.6 F (36.4 C) (Temporal)   Resp 16   Ht 5\' 3"  (1.6 m)   Wt 252 lb 9.6 oz (114.6 kg)   BMI 44.75 kg/m   Body mass index is 44.75 kg/m.  Advanced Directives 03/23/2018 11/21/2016 08/03/2016  Does Patient Have a Medical Advance Directive? No;Yes Yes Yes  Type of Paramedic of [place];Living will [place];Living will [place];Living will  Does patient want to make changes to medical advance directive? - - No - Patient declined  Copy of [place] in Chart? No - copy requested No - copy requested No - copy requested    Tobacco Social History   Tobacco Use  Smoking Status Never Smoker  Smokeless Tobacco Never Used     Counseling given: Not Answered   Clinical Intake:  Pre-visit preparation completed: Yes  Pain : No/denies pain     Nutritional Status: BMI > 30  Obese Nutritional Risks: None Diabetes: No  How often do you need to have someone help you when you read instructions, pamphlets, or other written materials from your doctor or pharmacy?: 1 - Never What is the last grade level you completed in school?: some college   Interpreter Needed?: No  Information entered by :: Keaghan Bowens,LPN   Past Medical History:  Diagnosis Date  . Allergy   . Anemia   . Anginal pain ([place])   . Anxiety   . Arthritis   . Asthma   . Body mass index 40.0-44.9, adult ([place])   . Cough   . GERD (gastroesophageal reflux disease)   . Heart murmur   . Hyperlipidemia   . Hypertension   . IBS (irritable bowel syndrome)   . Kidney stone   .  Mitral incompetence   . Palpitations   . Pancreatic cyst   . Pancreatic pseudocyst   . Thyroid cyst   . Type II diabetes mellitus with renal manifestations ([place])   . Wheezing    Past Surgical History:  Procedure Laterality Date  . ABDOMINAL HYSTERECTOMY  2004  . CATARACT EXTRACTION W/PHACO Right 08/03/2016   Procedure: CATARACT EXTRACTION PHACO AND INTRAOCULAR LENS PLACEMENT (IOC);  Surgeon: Estill Cotta, MD;  Location: [facility] ORS;  Service: Ophthalmology;  Laterality: Right;  fluid pack lot # [id number] [place]  1:05.5   AP    20.8 CDE  25.75   . CATARACT EXTRACTION W/PHACO Left 08/31/2016   Procedure: CATARACT EXTRACTION PHACO AND INTRAOCULAR LENS PLACEMENT (IOC);  Surgeon: Estill Cotta, MD;  Location: [facility] ORS;  Service: Ophthalmology;  Laterality: Left;  [place] 49.9 AP% 21.8 CDE 20.41 Fluid pack lot # [id number]  . CHOLECYSTECTOMY  2013  . COLONOSCOPY  2004  . DILATION AND CURETTAGE OF UTERUS    . FRACTURE SURGERY    . left wrist fracture Left June 2016   with plate  . TONSILLECTOMY    . TONSILLECTOMY AND ADENOIDECTOMY     Family History  Problem Relation Age of Onset  . Diabetes Mother   . Heart disease Father   . Heart disease Maternal Grandmother   . Heart disease Maternal  Grandfather   . Heart disease Paternal Grandmother    Social History   Socioeconomic History  . Marital status: Widowed    Spouse name: Not on file  . Number of children: Not on file  . Years of education: Not on file  . Highest education level: Not on file  Occupational History  . Not on file  Social Needs  . Financial resource strain: Not hard at all  . Food insecurity:    Worry: Never true    Inability: Never true  . Transportation needs:    Medical: No    Non-medical: No  Tobacco Use  . Smoking status: Never Smoker  . Smokeless tobacco: Never Used  Substance and Sexual Activity  . Alcohol use: No  . Drug use: No  . Sexual activity: Not on file  Lifestyle  . Physical activity:     Days per week: 0 days    Minutes per session: 0 min  . Stress: Not at all  Relationships  . Social connections:    Talks on phone: More than three times a week    Gets together: More than three times a week    Attends religious service: More than 4 times per year    Active member of club or organization: Yes    Attends meetings of clubs or organizations: More than 4 times per year    Relationship status: Married  Other Topics Concern  . Not on file  Social History Narrative   Working full time     Outpatient Encounter Medications as of 03/23/2018  Medication Sig  . albuterol (PROAIR HFA) 108 (90 Base) MCG/ACT inhaler Inhale 1-2 puffs into the lungs every 6 (six) hours as needed for wheezing or shortness of breath.   Marland Kitchen atenolol (TENORMIN) 100 MG tablet TAKE 1 TABLET DAILY  . atorvastatin (LIPITOR) 20 MG tablet TAKE 1 TABLET DAILY  . benzonatate (TESSALON) 100 MG capsule Take 1 capsule (100 mg total) by mouth 2 (two) times daily as needed for cough.  . Cholecalciferol (VITAMIN D3) 5000 UNITS TABS Take 5,000 Units by mouth daily. Reported on 11/02/2015  . colesevelam (WELCHOL) 625 MG tablet Take 3 tablets (1,875 mg total) by mouth 2 (two) times daily with a meal.  . DILT-XR 240 MG 24 hr capsule TAKE 1 CAPSULE DAILY  . KRILL OIL PO Take 1 capsule by mouth daily.   Marland Kitchen LANTUS SOLOSTAR 100 UNIT/ML Solostar Pen INJECT 40 UNITS            SUBCUTANEOUSLY DAILY AT    10PM  . liraglutide (VICTOZA) 18 MG/3ML SOPN Inject 0.2 mLs (1.2 mg total) into the skin daily.  Marland Kitchen losartan (COZAAR) 100 MG tablet Take 1 tablet (100 mg total) by mouth daily.  . montelukast (SINGULAIR) 10 MG tablet Take 1 tablet (10 mg total) by mouth daily.  . naproxen sodium (ANAPROX) 220 MG tablet Take 220 mg by mouth 2 (two) times daily with a meal.  . NOVOLOG FLEXPEN 100 UNIT/ML FlexPen INJECT 35 UNITS INTO THE   SKIN 3 TIMES A DAY WITH    MEALS (Patient taking differently: INJECT 45 UNITS INTO THE   SKIN 3 TIMES A DAY WITH     MEALS)  . amoxicillin-clavulanate (AUGMENTIN) 875-125 MG tablet Take 1 tablet by mouth 2 (two) times daily.  . fluticasone (FLONASE) 50 MCG/ACT nasal spray Place 1-2 sprays into both nostrils daily as needed for allergies.   Marland Kitchen losartan (COZAAR) 100 MG tablet TAKE 1 TABLET DAILY  .  WELCHOL 625 MG tablet TAKE 3 TABLETS (=1875MG )   TWO TIMES A DAY WITH MEALS (Patient not taking: Reported on 03/23/2018)   No facility-administered encounter medications on file as of 03/23/2018.     Activities of Daily Living In your present state of health, do you have any difficulty performing the following activities: 03/23/2018  Hearing? N  Vision? N  Difficulty concentrating or making decisions? N  Walking or climbing stairs? Y  Comment knee pain   Dressing or bathing? N  Doing errands, shopping? N  Preparing Food and eating ? N  Using the Toilet? N  In the past six months, have you accidently leaked urine? N  Do you have problems with loss of bowel control? N  Managing your Medications? N  Managing your Finances? N  Housekeeping or managing your Housekeeping? N  Some recent data might be hidden    Patient Care Team: Guadalupe Maple, MD as PCP - General (Unknown Physician Specialty) Erby Pian, MD as Referring Physician (Specialist) Byrnett, Forest Gleason, MD (General Surgery) Clyde Canterbury, MD as Referring Physician (Otolaryngology) Corey Skains, MD as Consulting Physician (Internal Medicine) Rod Mae, MD as Referring Physician (Orthopedic Surgery)    Assessment:   This is a routine wellness examination for Elly.  Exercise Activities and Dietary recommendations Current Exercise Habits: The patient does not participate in regular exercise at present, Exercise limited by: None identified  Goals    . DIET - INCREASE WATER INTAKE     Recommend drinking at least 6-8 glasses of water a day        Fall Risk Fall Risk  03/23/2018 09/18/2017 09/18/2017 01/02/2017 11/21/2016  Falls in  the past year? No No No (No Data) No  Comment - - - no falls since previous visit -  Number falls in past yr: - - - - -  Injury with Fall? - - - - -  Risk for fall due to : - - - - -  Follow up - - - - -   Is the patient's home free of loose throw rugs in walkways, pet beds, electrical cords, etc?   yes      Grab bars in the bathroom? no      Handrails on the stairs?   no      Adequate lighting?   yes  Timed Get Up and Go performed: Completed in 8 seconds with no use of assistive devices, steady gait. No intervention needed at this time.   Depression Screen PHQ 2/9 Scores 03/23/2018 09/18/2017 09/18/2017 11/21/2016  PHQ - 2 Score 0 0 0 0     Cognitive Function     6CIT Screen 03/23/2018  What Year? 0 points  What month? 0 points  What time? 0 points  Count back from 20 0 points  Months in reverse 0 points  Repeat phrase 0 points  Total Score 0    Immunization History  Administered Date(s) Administered  . Influenza, High Dose Seasonal PF 08/19/2016, 09/18/2017  . Influenza,inj,Quad PF,6+ Mos 07/13/2015  . Influenza-Unspecified 08/19/2016  . Pneumococcal Conjugate-13 09/01/2015  . Pneumococcal-Unspecified 06/03/2001, 12/31/2008  . Td 10/08/2007  . Zoster 12/03/2009    Qualifies for Shingles Vaccine? Yes, discussed shingrix vaccine   Screening Tests Health Maintenance  Topic Date Due  . PNA vac Low Risk Adult (2 of 2 - PPSV23) 08/31/2016  . MAMMOGRAM  09/01/2016  . TETANUS/TDAP  10/07/2017  . HEMOGLOBIN A1C  03/19/2018  . INFLUENZA VACCINE  05/03/2018  .  FOOT EXAM  09/18/2018  . OPHTHALMOLOGY EXAM  09/19/2018  . COLONOSCOPY  08/15/2023  . DEXA SCAN  Completed  . Hepatitis C Screening  Completed    Cancer Screenings: Lung: Low Dose CT Chest recommended if Age 13-80 years, 30 pack-year currently smoking OR have quit w/in 15years. Patient does not qualify. Breast:  Up to date on Mammogram? No   Up to date of Bone Density/Dexa? Yes 08/14/2013 Colorectal:  completed 08/14/2013  Additional Screenings:  Hepatitis C Screening: completed 02/03/2016     Plan:    I have personally reviewed and addressed the Medicare Annual Wellness questionnaire and have noted the following in the patient's chart:  A. Medical and social history B. Use of alcohol, tobacco or illicit drugs  C. Current medications and supplements D. Functional ability and status E.  Nutritional status F.  Physical activity G. Advance directives H. List of other physicians I.  Hospitalizations, surgeries, and ER visits in previous 12 months J.  [place] such as hearing and vision if needed, cognitive and depression L. Referrals and appointments   In addition, I have reviewed and discussed with patient certain preventive protocols, quality metrics, and best practice recommendations. A written personalized care plan for preventive services as well as general preventive health recommendations were provided to patient.   Signed,  Tyler Aas, LPN Nurse Health Advisor   Nurse Notes:none

## 2018-03-23 NOTE — Patient Instructions (Addendum)
Melissa Singh , Thank you for taking time to come for your Medicare Wellness Visit. I appreciate your ongoing commitment to your health goals. Please review the following plan we discussed and let me know if I can assist you in the future.   Screening recommendations/referrals: Colonoscopy: completed Mammogram: due now, Please call [phone] to schedule your mammogram.  Bone Density: completed Recommended yearly ophthalmology/optometry visit for glaucoma screening and checkup Recommended yearly dental visit for hygiene and checkup  Vaccinations: Influenza vaccine: due 06/2018 Pneumococcal vaccine: completed  Tdap vaccine: due, check with your insurance company for coverage Shingles vaccine:shingrix eligible, check with your insurance company for coverage     Advanced directives: Please bring a copy of your health care power of attorney and living will to the office at your convenience.  Conditions/risks identified: Recommend drinking at least 6-8 glasses of water a day   Next appointment: Follow up on 04/03/2018 at 8:45am Dr.Crissman.  with Follow up in one year for your annual wellness exam.    Preventive Care 65 Years and Older, Female Preventive care refers to lifestyle choices and visits with your health care provider that can promote health and wellness. What does preventive care include?  A yearly physical exam. This is also called an annual well check.  Dental exams once or twice a year.  Routine eye exams. Ask your health care provider how often you should have your eyes checked.  Personal lifestyle choices, including:  Daily care of your teeth and gums.  Regular physical activity.  Eating a healthy diet.  Avoiding tobacco and drug use.  Limiting alcohol use.  Practicing safe sex.  Taking low-dose aspirin every day.  Taking vitamin and mineral supplements as recommended by your health care provider. What happens during an annual well check? The services and  screenings done by your health care provider during your annual well check will depend on your age, overall health, lifestyle risk factors, and family history of disease. Counseling  Your health care provider may ask you questions about your:  Alcohol use.  Tobacco use.  Drug use.  Emotional well-being.  Home and relationship well-being.  Sexual activity.  Eating habits.  History of falls.  Memory and ability to understand (cognition).  Work and work Statistician.  Reproductive health. Screening  You may have the following tests or measurements:  Height, weight, and BMI.  Blood pressure.  Lipid and cholesterol levels. These may be checked every 5 years, or more frequently if you are over 57 years old.  Skin check.  Lung cancer screening. You may have this screening every year starting at age 1 if you have a 30-pack-year history of smoking and currently smoke or have quit within the past 15 years.  Fecal occult blood test (FOBT) of the stool. You may have this test every year starting at age 40.  Flexible sigmoidoscopy or colonoscopy. You may have a sigmoidoscopy every 5 years or a colonoscopy every 10 years starting at age 28.  Hepatitis C blood test.  Hepatitis B blood test.  Sexually transmitted disease (STD) testing.  Diabetes screening. This is done by checking your blood sugar (glucose) after you have not eaten for a while (fasting). You may have this done every 1-3 years.  Bone density scan. This is done to screen for osteoporosis. You may have this done starting at age 39.  Mammogram. This may be done every 1-2 years. Talk to your health care provider about how often you should have regular mammograms. Talk  with your health care provider about your test results, treatment options, and if necessary, the need for more tests. Vaccines  Your health care provider may recommend certain vaccines, such as:  Influenza vaccine. This is recommended every  year.  Tetanus, diphtheria, and acellular pertussis (Tdap, Td) vaccine. You may need a Td booster every 10 years.  Zoster vaccine. You may need this after age 23.  Pneumococcal 13-valent conjugate (PCV13) vaccine. One dose is recommended after age 79.  Pneumococcal polysaccharide (PPSV23) vaccine. One dose is recommended after age 61. Talk to your health care provider about which screenings and vaccines you need and how often you need them. This information is not intended to replace advice given to you by your health care provider. Make sure you discuss any questions you have with your health care provider. Document Released: 10/16/2015 Document Revised: 06/08/2016 Document Reviewed: 07/21/2015 Elsevier Interactive Patient Education  2017 [place] Prevention in the Home Falls can cause injuries. They can happen to people of all ages. There are many things you can do to make your home safe and to help prevent falls. What can I do on the outside of my home?  Regularly fix the edges of walkways and driveways and fix any cracks.  Remove anything that might make you trip as you walk through a door, such as a raised step or threshold.  Trim any bushes or trees on the path to your home.  Use bright outdoor lighting.  Clear any walking paths of anything that might make someone trip, such as rocks or tools.  Regularly check to see if handrails are loose or broken. Make sure that both sides of any steps have handrails.  Any raised decks and porches should have guardrails on the edges.  Have any leaves, snow, or ice cleared regularly.  Use sand or salt on walking paths during winter.  Clean up any spills in your garage right away. This includes oil or grease spills. What can I do in the bathroom?  Use night lights.  Install grab bars by the toilet and in the tub and shower. Do not use towel bars as grab bars.  Use non-skid mats or decals in the tub or shower.  If you  need to sit down in the shower, use a plastic, non-slip stool.  Keep the floor dry. Clean up any water that spills on the floor as soon as it happens.  Remove soap buildup in the tub or shower regularly.  Attach bath mats securely with double-sided non-slip rug tape.  Do not have throw rugs and other things on the floor that can make you trip. What can I do in the bedroom?  Use night lights.  Make sure that you have a light by your bed that is easy to reach.  Do not use any sheets or blankets that are too big for your bed. They should not hang down onto the floor.  Have a firm chair that has side arms. You can use this for support while you get dressed.  Do not have throw rugs and other things on the floor that can make you trip. What can I do in the kitchen?  Clean up any spills right away.  Avoid walking on wet floors.  Keep items that you use a lot in easy-to-reach places.  If you need to reach something above you, use a strong step stool that has a grab bar.  Keep electrical cords out of the way.  Do  not use floor polish or wax that makes floors slippery. If you must use wax, use non-skid floor wax.  Do not have throw rugs and other things on the floor that can make you trip. What can I do with my stairs?  Do not leave any items on the stairs.  Make sure that there are handrails on both sides of the stairs and use them. Fix handrails that are broken or loose. Make sure that handrails are as [place] as the stairways.  Check any carpeting to make sure that it is firmly attached to the stairs. Fix any carpet that is loose or worn.  Avoid having throw rugs at the top or bottom of the stairs. If you do have throw rugs, attach them to the floor with carpet tape.  Make sure that you have a light switch at the top of the stairs and the bottom of the stairs. If you do not have them, ask someone to add them for you. What else can I do to help prevent falls?  Wear shoes  that:  Do not have high heels.  Have rubber bottoms.  Are comfortable and fit you well.  Are closed at the toe. Do not wear sandals.  If you use a stepladder:  Make sure that it is fully opened. Do not climb a closed stepladder.  Make sure that both sides of the stepladder are locked into place.  Ask someone to hold it for you, if possible.  Clearly mark and make sure that you can see:  Any grab bars or handrails.  First and last steps.  Where the edge of each step is.  Use tools that help you move around (mobility aids) if they are needed. These include:  Canes.  Walkers.  Scooters.  Crutches.  Turn on the lights when you go into a dark area. Replace any light bulbs as soon as they burn out.  Set up your furniture so you have a clear path. Avoid moving your furniture around.  If any of your floors are uneven, fix them.  If there are any pets around you, be aware of where they are.  Review your medicines with your doctor. Some medicines can make you feel dizzy. This can increase your chance of falling. Ask your doctor what other things that you can do to help prevent falls. This information is not intended to replace advice given to you by your health care provider. Make sure you discuss any questions you have with your health care provider. Document Released: 07/16/2009 Document Revised: 02/25/2016 Document Reviewed: 10/24/2014 Elsevier Interactive Patient Education  2017 [employer].

## 2018-03-26 ENCOUNTER — Ambulatory Visit: Payer: Medicare Other | Admitting: Family Medicine

## 2018-04-03 ENCOUNTER — Encounter: Payer: Self-pay | Admitting: Family Medicine

## 2018-04-03 ENCOUNTER — Ambulatory Visit (INDEPENDENT_AMBULATORY_CARE_PROVIDER_SITE_OTHER): Payer: Medicare Other | Admitting: Family Medicine

## 2018-04-03 VITALS — BP 122/68 | HR 83 | Ht 65.0 in | Wt 252.8 lb

## 2018-04-03 DIAGNOSIS — T7840XD Allergy, unspecified, subsequent encounter: Secondary | ICD-10-CM | POA: Diagnosis not present

## 2018-04-03 DIAGNOSIS — S61209A Unspecified open wound of unspecified finger without damage to nail, initial encounter: Secondary | ICD-10-CM | POA: Diagnosis not present

## 2018-04-03 DIAGNOSIS — Z794 Long term (current) use of insulin: Secondary | ICD-10-CM

## 2018-04-03 DIAGNOSIS — T7840XA Allergy, unspecified, initial encounter: Secondary | ICD-10-CM | POA: Insufficient documentation

## 2018-04-03 DIAGNOSIS — S61012A Laceration without foreign body of left thumb without damage to nail, initial encounter: Secondary | ICD-10-CM

## 2018-04-03 DIAGNOSIS — I10 Essential (primary) hypertension: Secondary | ICD-10-CM | POA: Diagnosis not present

## 2018-04-03 DIAGNOSIS — E119 Type 2 diabetes mellitus without complications: Secondary | ICD-10-CM

## 2018-04-03 DIAGNOSIS — N182 Chronic kidney disease, stage 2 (mild): Secondary | ICD-10-CM | POA: Diagnosis not present

## 2018-04-03 DIAGNOSIS — E1122 Type 2 diabetes mellitus with diabetic chronic kidney disease: Secondary | ICD-10-CM

## 2018-04-03 DIAGNOSIS — E7800 Pure hypercholesterolemia, unspecified: Secondary | ICD-10-CM

## 2018-04-03 DIAGNOSIS — Z23 Encounter for immunization: Secondary | ICD-10-CM

## 2018-04-03 LAB — LP+ALT+AST PICCOLO, WAIVED
ALT (SGPT) PICCOLO, WAIVED: 25 U/L (ref 10–47)
AST (SGOT) Piccolo, Waived: 19 U/L (ref 11–38)
CHOLESTEROL PICCOLO, WAIVED: 147 mg/dL (ref <200)
Chol/HDL Ratio Piccolo,Waive: 2.6 mg/dL
HDL Chol Piccolo, Waived: 56 mg/dL — ABNORMAL LOW (ref >59)
LDL Chol Calc Piccolo Waived: 50 mg/dL (ref <100)
TRIGLYCERIDES PICCOLO,WAIVED: 207 mg/dL — AB (ref <150)
VLDL Chol Calc Piccolo,Waive: 41 mg/dL — ABNORMAL HIGH (ref <30)

## 2018-04-03 MED ORDER — FLUTICASONE PROPIONATE 50 MCG/ACT NA SUSP: 1.0000 | Freq: Every day | NASAL | 12 refills | Status: DC | PRN

## 2018-04-03 NOTE — Assessment & Plan Note (Signed)
Discussed allergy care and treatment possibility of referral to ear nose and throat for further allergy treatment will start Allegra and Flonase. Will make decision next month regarding cough and medication blood pressure allergies.

## 2018-04-03 NOTE — Progress Notes (Signed)
BP 122/68   Pulse 83   Ht 5\' 5"  (1.651 m)   Wt 252 lb 12.8 oz (114.7 kg)   SpO2 95%   BMI 42.07 kg/m    Subjective:    Patient ID: Melissa Singh, female    DOB: 01-13-1951, 67 y.o.   MRN: [id number]  HPI: Melissa Singh is a 67 y.o. female  Chief Complaint  Patient presents with  . Follow-up  . Hyperlipidemia  . Hypertension  Patient with wound and fingernail area healing well but needs a tetanus shot go ahead and gave tetanus today. Patient also concerned with chronic cough concerned that it is related to allergies.  Requesting allergy referral. Have reviewed patient's allergy treatment is tried Advertising account planner with no real relief. On review of medicine patient is taking losartan. Followed by endocrinology for diabetes and doing stable. Blood pressure cholesterol stable and no issues. Relevant past medical, surgical, family and social history reviewed and updated as indicated. Interim medical history since our last visit reviewed. Allergies and medications reviewed and updated.  Review of Systems  Constitutional: Negative.   Respiratory: Negative.   Cardiovascular: Negative.     Per HPI unless specifically indicated above     Objective:    BP 122/68   Pulse 83   Ht 5\' 5"  (1.651 m)   Wt 252 lb 12.8 oz (114.7 kg)   SpO2 95%   BMI 42.07 kg/m   Wt Readings from Last 3 Encounters:  04/03/18 252 lb 12.8 oz (114.7 kg)  03/23/18 252 lb 9.6 oz (114.6 kg)  09/18/17 253 lb (114.8 kg)    Physical Exam  Constitutional: She is oriented to person, place, and time. She appears well-developed and well-nourished.  HENT:  Head: Normocephalic and atraumatic.  Eyes: Conjunctivae and EOM are normal.  Neck: Normal range of motion.  Cardiovascular: Normal rate, regular rhythm and normal heart sounds.  Pulmonary/Chest: Effort normal and breath sounds normal.  Musculoskeletal: Normal range of motion.  Neurological: She is alert and oriented to person, place, and time.    Skin: No erythema.  Psychiatric: She has a normal mood and affect. Her behavior is normal. Judgment and thought content normal.    Results for orders placed or performed in visit on 09/20/17  HM DIABETES EYE EXAM  Result Value Ref Range   HM Diabetic Eye Exam No Retinopathy No Retinopathy      Assessment & Plan:   Problem List Items Addressed This Visit      Cardiovascular and Mediastinum   Essential hypertension - Primary    Discussed hypertension good control possibility of losartan causing cough. Will discontinue losartan observe blood pressure and cough recheck 1 month.      Relevant Orders   Basic metabolic panel   LP+ALT+AST [place], Waived     Endocrine   Diabetes mellitus ([place])   Relevant Orders   Basic metabolic panel   LP+ALT+AST [place], Waived   Type II diabetes mellitus with renal manifestations ([place])    Followed by endocrinology        Other   Hypercholesteremia   Relevant Orders   Basic metabolic panel   LP+ALT+AST [place], Waived   Allergy    Discussed allergy care and treatment possibility of referral to ear nose and throat for further allergy treatment will start Allegra and Flonase. Will make decision next month regarding cough and medication blood pressure allergies.       Other Visit Diagnoses    Need for Tdap  vaccination       Finger wound, simple, open, initial encounter       Discussed care and treatment will give tetanus shot patient healing well.       Follow up plan: Return in about 1 month (around 05/01/2018), or if symptoms worsen or fail to improve.

## 2018-04-03 NOTE — Patient Instructions (Signed)
To schedule your mammogram please give our scheduling line a call at [phone]. Thank you.

## 2018-04-03 NOTE — Assessment & Plan Note (Signed)
Followed by endocrinology 

## 2018-04-03 NOTE — Assessment & Plan Note (Signed)
Discussed hypertension good control possibility of losartan causing cough. Will discontinue losartan observe blood pressure and cough recheck 1 month.

## 2018-04-04 LAB — BASIC METABOLIC PANEL
BUN / CREAT RATIO: 19 (ref 12–28)
BUN: 24 mg/dL (ref 8–27)
CALCIUM: 10.6 mg/dL — AB (ref 8.7–10.3)
CHLORIDE: 106 mmol/L (ref 96–106)
CO2: 20 mmol/L (ref 20–29)
CREATININE: 1.28 mg/dL — AB (ref 0.57–1.00)
GFR, EST AFRICAN AMERICAN: 50 mL/min/{1.73_m2} — AB (ref >59)
GFR, EST NON AFRICAN AMERICAN: 43 mL/min/{1.73_m2} — AB (ref >59)
Glucose: 164 mg/dL — ABNORMAL HIGH (ref 65–99)
Potassium: 4.9 mmol/L (ref 3.5–5.2)
Sodium: 139 mmol/L (ref 134–144)

## 2018-04-05 ENCOUNTER — Other Ambulatory Visit: Payer: Self-pay | Admitting: Family Medicine

## 2018-04-05 DIAGNOSIS — I10 Essential (primary) hypertension: Secondary | ICD-10-CM

## 2018-04-09 ENCOUNTER — Telehealth: Payer: Self-pay | Admitting: Family Medicine

## 2018-04-09 DIAGNOSIS — N183 Chronic kidney disease, stage 3 unspecified: Secondary | ICD-10-CM

## 2018-04-09 NOTE — Telephone Encounter (Signed)
Phone call Discussed with patient declining renal function has been taking aspirin 81 mg a day on strong recommendation from cardiology. Will take aspirin every other day and recheck BMP 1 month.

## 2018-04-09 NOTE — Telephone Encounter (Signed)
-----   Message from Amada Kingfisher, [place] sent at 04/09/2018  4:53 PM EDT ----- Patient was transferred to provider for telephone conversation.

## 2018-04-27 ENCOUNTER — Other Ambulatory Visit: Payer: Medicare Other

## 2018-04-27 DIAGNOSIS — N183 Chronic kidney disease, stage 3 unspecified: Secondary | ICD-10-CM

## 2018-04-28 LAB — BASIC METABOLIC PANEL
BUN / CREAT RATIO: 23 (ref 12–28)
BUN: 28 mg/dL — ABNORMAL HIGH (ref 8–27)
CHLORIDE: 107 mmol/L — AB (ref 96–106)
CO2: 20 mmol/L (ref 20–29)
Calcium: 10.6 mg/dL — ABNORMAL HIGH (ref 8.7–10.3)
Creatinine, Ser: 1.24 mg/dL — ABNORMAL HIGH (ref 0.57–1.00)
GFR calc Af Amer: 52 mL/min/{1.73_m2} — ABNORMAL LOW (ref >59)
GFR calc non Af Amer: 45 mL/min/{1.73_m2} — ABNORMAL LOW (ref >59)
GLUCOSE: 65 mg/dL (ref 65–99)
Potassium: 4.8 mmol/L (ref 3.5–5.2)
Sodium: 140 mmol/L (ref 134–144)

## 2018-04-29 ENCOUNTER — Encounter: Payer: Self-pay | Admitting: Family Medicine

## 2018-05-30 DIAGNOSIS — E1165 Type 2 diabetes mellitus with hyperglycemia: Secondary | ICD-10-CM | POA: Diagnosis not present

## 2018-05-30 DIAGNOSIS — Z794 Long term (current) use of insulin: Secondary | ICD-10-CM | POA: Diagnosis not present

## 2018-05-30 DIAGNOSIS — E042 Nontoxic multinodular goiter: Secondary | ICD-10-CM | POA: Diagnosis not present

## 2018-06-06 DIAGNOSIS — E1165 Type 2 diabetes mellitus with hyperglycemia: Secondary | ICD-10-CM | POA: Diagnosis not present

## 2018-06-06 DIAGNOSIS — I10 Essential (primary) hypertension: Secondary | ICD-10-CM | POA: Diagnosis not present

## 2018-06-06 DIAGNOSIS — Z794 Long term (current) use of insulin: Secondary | ICD-10-CM | POA: Diagnosis not present

## 2018-06-06 DIAGNOSIS — E042 Nontoxic multinodular goiter: Secondary | ICD-10-CM | POA: Diagnosis not present

## 2018-06-06 DIAGNOSIS — E785 Hyperlipidemia, unspecified: Secondary | ICD-10-CM | POA: Diagnosis not present

## 2018-06-06 DIAGNOSIS — E1169 Type 2 diabetes mellitus with other specified complication: Secondary | ICD-10-CM | POA: Diagnosis not present

## 2018-06-06 DIAGNOSIS — E1159 Type 2 diabetes mellitus with other circulatory complications: Secondary | ICD-10-CM | POA: Diagnosis not present

## 2018-06-07 ENCOUNTER — Encounter: Payer: Self-pay | Admitting: Family Medicine

## 2018-06-07 ENCOUNTER — Ambulatory Visit (INDEPENDENT_AMBULATORY_CARE_PROVIDER_SITE_OTHER): Payer: Medicare Other | Admitting: Family Medicine

## 2018-06-07 VITALS — BP 131/73 | HR 75 | Wt 261.0 lb

## 2018-06-07 DIAGNOSIS — Z794 Long term (current) use of insulin: Secondary | ICD-10-CM | POA: Diagnosis not present

## 2018-06-07 DIAGNOSIS — E119 Type 2 diabetes mellitus without complications: Secondary | ICD-10-CM | POA: Diagnosis not present

## 2018-06-07 DIAGNOSIS — Z23 Encounter for immunization: Secondary | ICD-10-CM

## 2018-06-07 DIAGNOSIS — R05 Cough: Secondary | ICD-10-CM | POA: Diagnosis not present

## 2018-06-07 DIAGNOSIS — R059 Cough, unspecified: Secondary | ICD-10-CM | POA: Insufficient documentation

## 2018-06-07 DIAGNOSIS — I10 Essential (primary) hypertension: Secondary | ICD-10-CM

## 2018-06-07 MED ORDER — OMEPRAZOLE 20 MG PO CPDR: 20.0000 mg | DELAYED_RELEASE_CAPSULE | Freq: Every day | ORAL | 3 refills | Status: DC

## 2018-06-07 NOTE — Progress Notes (Signed)
BP 131/73   Pulse 75   Wt 261 lb (118.4 kg)   SpO2 93%   BMI 43.43 kg/m    Subjective:    Patient ID: Melissa Singh, female    DOB: 11/02/1950, 67 y.o.   MRN: [id number]  HPI: Melissa Singh is a 67 y.o. female  Chief Complaint  Patient presents with  . Cough  . Hypertension  Patient follow-up blood pressure and cough unfortunately stopping losartan did not really make any difference in patient's cough. Patient is tried using Allegra and Flonase faithfully over last month with possibility of worsening her symptoms. Patient stayed inside due to dizziness over Labor Day weekend and cough was a little better during this time.  After going back outside cough seemed to get worse. Cough is definitely a bother as patient's coworkers worry about her and her heavy coughing. Blood pressure is done well off medications. Concerned about renal protection and ACE ARB usage but for now will leave alone. Discussed patient also sees pulmonary on a yearly basis has an appointment coming up soon.  Has normal reports will review cough with her pulmonary doctor.  Relevant past medical, surgical, family and social history reviewed and updated as indicated. Interim medical history since our last visit reviewed. Allergies and medications reviewed and updated.  Review of Systems  Constitutional: Negative.   Respiratory: Positive for cough.   Cardiovascular: Negative.     Per HPI unless specifically indicated above     Objective:    BP 131/73   Pulse 75   Wt 261 lb (118.4 kg)   SpO2 93%   BMI 43.43 kg/m   Wt Readings from Last 3 Encounters:  06/07/18 261 lb (118.4 kg)  04/03/18 252 lb 12.8 oz (114.7 kg)  03/23/18 252 lb 9.6 oz (114.6 kg)    Physical Exam  Constitutional: She is oriented to person, place, and time. She appears well-developed and well-nourished.  HENT:  Head: Normocephalic and atraumatic.  Eyes: Conjunctivae and EOM are normal.  Neck: Normal range of motion.    Cardiovascular: Normal rate, regular rhythm and normal heart sounds.  Pulmonary/Chest: Effort normal and breath sounds normal.  Musculoskeletal: Normal range of motion.  Neurological: She is alert and oriented to person, place, and time.  Skin: No erythema.  Psychiatric: She has a normal mood and affect. Her behavior is normal. Judgment and thought content normal.    Results for orders placed or performed in visit on 05/01/18  Hemoglobin A1c  Result Value Ref Range   Hemoglobin A1C 7.8       Assessment & Plan:   Problem List Items Addressed This Visit      Cardiovascular and Mediastinum   Essential hypertension    The current medical regimen is effective;  continue present plan and medications. Unfortunately stopping losartan did not make any difference with cough see further for cough work-up and evaluation         Endocrine   Diabetes mellitus ([place])    Followed by endocrinology and doing well.        Other   Cough    With ongoing cough and on chart review will get chest x-ray. We will also start Prilosec for possible reflux symptoms. Will follow-up in 1 to 2 months to assess results.       Other Visit Diagnoses    Needs flu shot    -  Primary   Relevant Orders   Flu vaccine HIGH DOSE PF (Fluzone High  dose) (Completed)       Follow up plan: Return in about 2 months (around 08/07/2018).

## 2018-06-07 NOTE — Assessment & Plan Note (Signed)
Followed by endocrinology and doing well 

## 2018-06-07 NOTE — Assessment & Plan Note (Signed)
The current medical regimen is effective;  continue present plan and medications. Unfortunately stopping losartan did not make any difference with cough see further for cough work-up and evaluation

## 2018-06-07 NOTE — Assessment & Plan Note (Signed)
With ongoing cough and on chart review will get chest x-ray. We will also start Prilosec for possible reflux symptoms. Will follow-up in 1 to 2 months to assess results.

## 2018-06-07 NOTE — Patient Instructions (Addendum)
Please call [facility] @ [phone] to schedule your mammogram.     Influenza (Flu) Vaccine (Inactivated or Recombinant): What You Need to Know 1. Why get vaccinated? Influenza ("flu") is a contagious disease that spreads around the [place] every year, usually between October and May. Flu is caused by influenza viruses, and is spread mainly by coughing, sneezing, and close contact. Anyone can get flu. Flu strikes suddenly and can last several days. Symptoms vary by age, but can include:  fever/chills  sore throat  muscle aches  fatigue  cough  headache  runny or stuffy nose  Flu can also lead to pneumonia and blood infections, and cause diarrhea and seizures in children. If you have a medical condition, such as heart or lung disease, flu can make it worse. Flu is more dangerous for some people. Infants and young children, people 58 years of age and older, pregnant women, and people with certain health conditions or a weakened immune system are at greatest risk. Each year thousands of people in the [place] die from flu, and many more are hospitalized. Flu vaccine can:  keep you from getting flu,  make flu less severe if you do get it, and  keep you from spreading flu to your family and other people. 2. Inactivated and recombinant flu vaccines A dose of flu vaccine is recommended every flu season. Children 6 months through 47 years of age may need two doses during the same flu season. Everyone else needs only one dose each flu season. Some inactivated flu vaccines contain a very small amount of a mercury-based preservative called thimerosal. Studies have not shown thimerosal in vaccines to be harmful, but flu vaccines that do not contain thimerosal are available. There is no live flu virus in flu shots. They cannot cause the flu. There are many flu viruses, and they are always changing. Each year a new flu vaccine is made to protect against three or four viruses that  are likely to cause disease in the upcoming flu season. But even when the vaccine doesn't exactly match these viruses, it may still provide some protection. Flu vaccine cannot prevent:  flu that is caused by a virus not covered by the vaccine, or  illnesses that look like flu but are not.  It takes about 2 weeks for protection to develop after vaccination, and protection lasts through the flu season. 3. Some people should not get this vaccine Tell the person who is giving you the vaccine:  If you have any severe, life-threatening allergies. If you ever had a life-threatening allergic reaction after a dose of flu vaccine, or have a severe allergy to any part of this vaccine, you may be advised not to get vaccinated. Most, but not all, types of flu vaccine contain a small amount of egg protein.  If you ever had Guillain-Barr Syndrome (also called GBS). Some people with a history of GBS should not get this vaccine. This should be discussed with your doctor.  If you are not feeling well. It is usually okay to get flu vaccine when you have a mild illness, but you might be asked to come back when you feel better.  4. Risks of a vaccine reaction With any medicine, including vaccines, there is a chance of reactions. These are usually mild and go away on their own, but serious reactions are also possible. Most people who get a flu shot do not have any problems with it. Minor problems following a flu shot include:  soreness, redness, or swelling where the shot was given  hoarseness  sore, red or itchy eyes  cough  fever  aches  headache  itching  fatigue  If these problems occur, they usually begin soon after the shot and last 1 or 2 days. More serious problems following a flu shot can include the following:  There may be a small increased risk of Guillain-Barre Syndrome (GBS) after inactivated flu vaccine. This risk has been estimated at 1 or 2 additional cases per million people  vaccinated. This is much lower than the risk of severe complications from flu, which can be prevented by flu vaccine.  Young children who get the flu shot along with pneumococcal vaccine (PCV13) and/or DTaP vaccine at the same time might be slightly more likely to have a seizure caused by fever. Ask your doctor for more information. Tell your doctor if a child who is getting flu vaccine has ever had a seizure.  Problems that could happen after any injected vaccine:  People sometimes faint after a medical procedure, including vaccination. Sitting or lying down for about 15 minutes can help prevent fainting, and injuries caused by a fall. Tell your doctor if you feel dizzy, or have vision changes or ringing in the ears.  Some people get severe pain in the shoulder and have difficulty moving the arm where a shot was given. This happens very rarely.  Any medication can cause a severe allergic reaction. Such reactions from a vaccine are very rare, estimated at about 1 in a million doses, and would happen within a few minutes to a few hours after the vaccination. As with any medicine, there is a very remote chance of a vaccine causing a serious injury or death. The safety of vaccines is always being monitored. For more information, visit: http://www.aguilar.org/ 5. What if there is a serious reaction? What should I look for? Look for anything that concerns you, such as signs of a severe allergic reaction, very high fever, or unusual behavior. Signs of a severe allergic reaction can include hives, swelling of the face and throat, difficulty breathing, a fast heartbeat, dizziness, and weakness. These would start a few minutes to a few hours after the vaccination. What should I do?  If you think it is a severe allergic reaction or other emergency that can't wait, call 9-1-1 and get the person to the nearest hospital. Otherwise, call your doctor.  Reactions should be reported to the Vaccine Adverse  Event Reporting System (VAERS). Your doctor should file this report, or you can do it yourself through the VAERS web site at www.vaers.SamedayNews.es, or by calling 985-321-8343. ? VAERS does not give medical advice. 6. The National Vaccine Injury Compensation Program The [employer] Vaccine Injury Compensation Program (VICP) is a federal program that was created to compensate people who may have been injured by certain vaccines. Persons who believe they may have been injured by a vaccine can learn about the program and about filing a claim by calling 613-477-7420 or visiting the [place] website at GoldCloset.com.ee. There is a time limit to file a claim for compensation. 7. How can I learn more?  Ask your healthcare provider. He or she can give you the vaccine package insert or suggest other sources of information.  Call your local or state health department.  Contact the Centers for Disease Control and Prevention (CDC): ? Call (906)462-7865 (1-800-CDC-INFO) or ? Visit CDC's website at https://gibson.com/ Vaccine Information Statement, Inactivated Influenza Vaccine (05/09/2014) This information is not intended  to replace advice given to you by your health care provider. Make sure you discuss any questions you have with your health care provider. Document Released: 07/14/2006 Document Revised: 06/09/2016 Document Reviewed: 06/09/2016 Elsevier Interactive Patient Education  2017 [employer].

## 2018-06-08 ENCOUNTER — Ambulatory Visit
Admission: RE | Admit: 2018-06-08 | Discharge: 2018-06-08 | Disposition: A | Discharge status: Home / Self Care | Payer: Medicare Other | Source: Ambulatory Visit | Attending: Family Medicine | Admitting: Family Medicine

## 2018-06-08 DIAGNOSIS — R05 Cough: Secondary | ICD-10-CM

## 2018-06-08 DIAGNOSIS — R059 Cough, unspecified: Secondary | ICD-10-CM

## 2018-06-11 ENCOUNTER — Telehealth: Payer: Self-pay | Admitting: Family Medicine

## 2018-06-11 NOTE — Telephone Encounter (Signed)
Please let her know that her x-ray was negative. Thanks!

## 2018-06-11 NOTE — Telephone Encounter (Signed)
Message relayed to patient. Verbalized understanding and denied questions.   

## 2018-06-17 ENCOUNTER — Other Ambulatory Visit: Payer: Self-pay | Admitting: Family Medicine

## 2018-06-17 DIAGNOSIS — I10 Essential (primary) hypertension: Secondary | ICD-10-CM

## 2018-06-18 DIAGNOSIS — J310 Chronic rhinitis: Secondary | ICD-10-CM | POA: Diagnosis not present

## 2018-06-18 DIAGNOSIS — R05 Cough: Secondary | ICD-10-CM | POA: Diagnosis not present

## 2018-06-18 DIAGNOSIS — J4520 Mild intermittent asthma, uncomplicated: Secondary | ICD-10-CM | POA: Diagnosis not present

## 2018-06-23 ENCOUNTER — Other Ambulatory Visit: Payer: Self-pay | Admitting: Family Medicine

## 2018-06-23 DIAGNOSIS — N182 Chronic kidney disease, stage 2 (mild): Principal | ICD-10-CM

## 2018-06-23 DIAGNOSIS — Z794 Long term (current) use of insulin: Principal | ICD-10-CM

## 2018-06-23 DIAGNOSIS — E1122 Type 2 diabetes mellitus with diabetic chronic kidney disease: Secondary | ICD-10-CM

## 2018-07-11 DIAGNOSIS — J301 Allergic rhinitis due to pollen: Secondary | ICD-10-CM | POA: Diagnosis not present

## 2018-07-11 DIAGNOSIS — R05 Cough: Secondary | ICD-10-CM | POA: Diagnosis not present

## 2018-07-11 DIAGNOSIS — K219 Gastro-esophageal reflux disease without esophagitis: Secondary | ICD-10-CM | POA: Diagnosis not present

## 2018-07-13 DIAGNOSIS — J301 Allergic rhinitis due to pollen: Secondary | ICD-10-CM | POA: Diagnosis not present

## 2018-07-30 DIAGNOSIS — E119 Type 2 diabetes mellitus without complications: Secondary | ICD-10-CM | POA: Diagnosis not present

## 2018-07-30 LAB — HM DIABETES EYE EXAM

## 2018-08-08 ENCOUNTER — Ambulatory Visit (INDEPENDENT_AMBULATORY_CARE_PROVIDER_SITE_OTHER): Payer: Medicare Other | Admitting: Family Medicine

## 2018-08-08 ENCOUNTER — Encounter: Payer: Self-pay | Admitting: Family Medicine

## 2018-08-08 DIAGNOSIS — E7800 Pure hypercholesterolemia, unspecified: Secondary | ICD-10-CM | POA: Diagnosis not present

## 2018-08-08 DIAGNOSIS — I10 Essential (primary) hypertension: Secondary | ICD-10-CM | POA: Diagnosis not present

## 2018-08-08 NOTE — Assessment & Plan Note (Signed)
The current medical regimen is effective;  continue present plan and medications.  

## 2018-08-08 NOTE — Progress Notes (Signed)
   BP 110/69 (BP Location: Left Arm, Patient Position: Sitting, Cuff Size: Large)   Pulse 70   Temp 98.1 F (36.7 C) (Oral)   Ht 5\' 4"  (1.626 m)   Wt 259 lb 4.8 oz (117.6 kg)   SpO2 95%   BMI 44.51 kg/m    Subjective:    Patient ID: Melissa Singh, female    DOB: October 19, 1950, 67 y.o.   MRN: [id number]  HPI: Melissa Singh is a 67 y.o. female  Chief Complaint  Patient presents with  . Hypertension  Patient follow-up blood pressure doing well with no complaints taking atenolol without problems. Unfortunately cough is persisting may be not quite as bad has been through pulmonary who referred to ear nose and throat who changed allergy medicines and may be MRI of her sinuses in the future. Cholesterol otherwise stable.   Relevant past medical, surgical, family and social history reviewed and updated as indicated. Interim medical history since our last visit reviewed. Allergies and medications reviewed and updated.  Review of Systems  Constitutional: Negative.   Respiratory: Negative.   Cardiovascular: Negative.     Per HPI unless specifically indicated above     Objective:    BP 110/69 (BP Location: Left Arm, Patient Position: Sitting, Cuff Size: Large)   Pulse 70   Temp 98.1 F (36.7 C) (Oral)   Ht 5\' 4"  (1.626 m)   Wt 259 lb 4.8 oz (117.6 kg)   SpO2 95%   BMI 44.51 kg/m   Wt Readings from Last 3 Encounters:  08/08/18 259 lb 4.8 oz (117.6 kg)  06/07/18 261 lb (118.4 kg)  04/03/18 252 lb 12.8 oz (114.7 kg)    Physical Exam  Constitutional: She is oriented to person, place, and time. She appears well-developed and well-nourished.  HENT:  Head: Normocephalic and atraumatic.  Eyes: Conjunctivae and EOM are normal.  Neck: Normal range of motion.  Cardiovascular: Normal rate, regular rhythm and normal heart sounds.  Pulmonary/Chest: Effort normal and breath sounds normal.  Musculoskeletal: Normal range of motion.  Neurological: She is alert and oriented to person,  place, and time.  Skin: No erythema.  Psychiatric: She has a normal mood and affect. Her behavior is normal. Judgment and thought content normal.    Results for orders placed or performed in visit on 07/31/18  HM DIABETES EYE EXAM  Result Value Ref Range   HM Diabetic Eye Exam No Retinopathy No Retinopathy      Assessment & Plan:   Problem List Items Addressed This Visit      Cardiovascular and Mediastinum   Essential hypertension    The current medical regimen is effective;  continue present plan and medications.       Relevant Medications   isosorbide mononitrate (IMDUR) 30 MG 24 hr tablet     Other   Hypercholesteremia    The current medical regimen is effective;  continue present plan and medications.       Relevant Medications   isosorbide mononitrate (IMDUR) 30 MG 24 hr tablet   Morbid obesity (H[facility])    Discussed wt loss      Relevant Medications   TRESIBA FLEXTOUCH 200 UNIT/ML SOPN       Follow up plan: Return in about 2 months (around 10/08/2018) for Physical Exam.

## 2018-08-08 NOTE — Assessment & Plan Note (Signed)
Discussed wt loss 

## 2018-08-15 DIAGNOSIS — J019 Acute sinusitis, unspecified: Secondary | ICD-10-CM | POA: Diagnosis not present

## 2018-08-15 DIAGNOSIS — R05 Cough: Secondary | ICD-10-CM | POA: Diagnosis not present

## 2018-08-15 DIAGNOSIS — K219 Gastro-esophageal reflux disease without esophagitis: Secondary | ICD-10-CM | POA: Diagnosis not present

## 2018-08-21 DIAGNOSIS — X501XXA Overexertion from prolonged static or awkward postures, initial encounter: Secondary | ICD-10-CM | POA: Diagnosis not present

## 2018-08-21 DIAGNOSIS — M1712 Unilateral primary osteoarthritis, left knee: Secondary | ICD-10-CM | POA: Diagnosis not present

## 2018-08-21 DIAGNOSIS — M25562 Pain in left knee: Secondary | ICD-10-CM | POA: Diagnosis not present

## 2018-08-21 DIAGNOSIS — S83412A Sprain of medial collateral ligament of left knee, initial encounter: Secondary | ICD-10-CM | POA: Diagnosis not present

## 2018-08-29 ENCOUNTER — Other Ambulatory Visit: Payer: Self-pay | Admitting: Family Medicine

## 2018-08-29 NOTE — Telephone Encounter (Signed)
Requested Prescriptions  Pending Prescriptions Disp Refills  . omeprazole (PRILOSEC) 20 MG capsule [Pharmacy Med Name: OMEPRAZOLE DR 20 MG CAPSULE] 90 capsule 1    Sig: TAKE 1 CAPSULE BY MOUTH EVERY DAY     Gastroenterology: Proton Pump Inhibitors Passed - 08/29/2018  3:12 AM      Passed - Valid encounter within last 12 months    Recent Outpatient Visits          3 weeks ago Morbid obesity [facility])   [facility] [place], Jeannette How, MD   2 months ago Needs flu shot   [facility] Crissman, Jeannette How, MD   4 months ago Essential hypertension   [place] Crissman, Jeannette How, MD   11 months ago Essential hypertension   [place] Crissman, Jeannette How, MD   1 year ago Essential hypertension   [place], Jeannette How, MD      Future Appointments            In 2 months Crissman, Jeannette How, MD [place], [place]   In 7 months  [employer], [place]

## 2018-08-30 ENCOUNTER — Other Ambulatory Visit: Payer: Self-pay | Admitting: Family Medicine

## 2018-08-30 DIAGNOSIS — Z794 Long term (current) use of insulin: Principal | ICD-10-CM

## 2018-08-30 DIAGNOSIS — E1122 Type 2 diabetes mellitus with diabetic chronic kidney disease: Secondary | ICD-10-CM

## 2018-08-30 DIAGNOSIS — N182 Chronic kidney disease, stage 2 (mild): Principal | ICD-10-CM

## 2018-08-31 NOTE — Telephone Encounter (Signed)
Requested Prescriptions  Pending Prescriptions Disp Refills  . NOVOLOG FLEXPEN 100 UNIT/ML FlexPen [Pharmacy Med Name: NOVOLOG F/P  PEN 100U/ML] 45 mL 4    Sig: INJECT 35 UNITS INTO THE   SKIN 3 TIMES A DAY WITH    MEALS     Endocrinology:  Diabetes - Insulins Failed - 08/30/2018  4:17 AM      Failed - HBA1C is between 0 and 7.9 and within 180 days    Hemoglobin A1C  Date Value Ref Range Status  01/24/2018 7.8  Final         Passed - Valid encounter within last 6 months    Recent Outpatient Visits          3 weeks ago Morbid obesity [facility])   [facility] Crissman, Jeannette How, MD   2 months ago Needs flu shot   [facility] Crissman, Jeannette How, MD   5 months ago Essential hypertension   [place] Crissman, Jeannette How, MD   11 months ago Essential hypertension   [place] Crissman, Jeannette How, MD   1 year ago Essential hypertension   [place], Jeannette How, MD      Future Appointments            In 2 months Crissman, Jeannette How, MD [place], [place]   In 7 months  [employer], [place]

## 2018-09-03 DIAGNOSIS — M1712 Unilateral primary osteoarthritis, left knee: Secondary | ICD-10-CM | POA: Diagnosis not present

## 2018-09-10 DIAGNOSIS — Z6841 Body Mass Index (BMI) 40.0 and over, adult: Secondary | ICD-10-CM | POA: Diagnosis not present

## 2018-09-10 DIAGNOSIS — E1159 Type 2 diabetes mellitus with other circulatory complications: Secondary | ICD-10-CM | POA: Diagnosis not present

## 2018-09-10 DIAGNOSIS — I6523 Occlusion and stenosis of bilateral carotid arteries: Secondary | ICD-10-CM | POA: Diagnosis not present

## 2018-09-10 DIAGNOSIS — E1169 Type 2 diabetes mellitus with other specified complication: Secondary | ICD-10-CM | POA: Diagnosis not present

## 2018-09-10 DIAGNOSIS — E785 Hyperlipidemia, unspecified: Secondary | ICD-10-CM | POA: Diagnosis not present

## 2018-09-10 DIAGNOSIS — I10 Essential (primary) hypertension: Secondary | ICD-10-CM | POA: Diagnosis not present

## 2018-09-17 DIAGNOSIS — M25562 Pain in left knee: Secondary | ICD-10-CM | POA: Diagnosis not present

## 2018-09-19 ENCOUNTER — Other Ambulatory Visit: Payer: Self-pay | Admitting: Family Medicine

## 2018-09-19 DIAGNOSIS — I10 Essential (primary) hypertension: Secondary | ICD-10-CM

## 2018-10-02 DIAGNOSIS — E042 Nontoxic multinodular goiter: Secondary | ICD-10-CM | POA: Diagnosis not present

## 2018-10-02 DIAGNOSIS — Z794 Long term (current) use of insulin: Secondary | ICD-10-CM | POA: Diagnosis not present

## 2018-10-02 DIAGNOSIS — I10 Essential (primary) hypertension: Secondary | ICD-10-CM | POA: Diagnosis not present

## 2018-10-02 DIAGNOSIS — E559 Vitamin D deficiency, unspecified: Secondary | ICD-10-CM | POA: Diagnosis not present

## 2018-10-02 DIAGNOSIS — E785 Hyperlipidemia, unspecified: Secondary | ICD-10-CM | POA: Diagnosis not present

## 2018-10-02 DIAGNOSIS — E1159 Type 2 diabetes mellitus with other circulatory complications: Secondary | ICD-10-CM | POA: Diagnosis not present

## 2018-10-02 DIAGNOSIS — E1165 Type 2 diabetes mellitus with hyperglycemia: Secondary | ICD-10-CM | POA: Diagnosis not present

## 2018-10-02 DIAGNOSIS — E1169 Type 2 diabetes mellitus with other specified complication: Secondary | ICD-10-CM | POA: Diagnosis not present

## 2018-10-05 DIAGNOSIS — M25562 Pain in left knee: Secondary | ICD-10-CM | POA: Diagnosis not present

## 2018-10-05 DIAGNOSIS — R2689 Other abnormalities of gait and mobility: Secondary | ICD-10-CM | POA: Diagnosis not present

## 2018-10-05 DIAGNOSIS — M25572 Pain in left ankle and joints of left foot: Secondary | ICD-10-CM | POA: Diagnosis not present

## 2018-10-09 DIAGNOSIS — E042 Nontoxic multinodular goiter: Secondary | ICD-10-CM | POA: Diagnosis not present

## 2018-10-09 DIAGNOSIS — E1159 Type 2 diabetes mellitus with other circulatory complications: Secondary | ICD-10-CM | POA: Diagnosis not present

## 2018-10-09 DIAGNOSIS — I10 Essential (primary) hypertension: Secondary | ICD-10-CM | POA: Diagnosis not present

## 2018-10-09 DIAGNOSIS — E1169 Type 2 diabetes mellitus with other specified complication: Secondary | ICD-10-CM | POA: Diagnosis not present

## 2018-10-09 DIAGNOSIS — E785 Hyperlipidemia, unspecified: Secondary | ICD-10-CM | POA: Diagnosis not present

## 2018-10-09 DIAGNOSIS — Z794 Long term (current) use of insulin: Secondary | ICD-10-CM | POA: Diagnosis not present

## 2018-10-09 DIAGNOSIS — E1165 Type 2 diabetes mellitus with hyperglycemia: Secondary | ICD-10-CM | POA: Diagnosis not present

## 2018-10-11 ENCOUNTER — Other Ambulatory Visit: Payer: Self-pay | Admitting: Family Medicine

## 2018-10-11 DIAGNOSIS — E7800 Pure hypercholesterolemia, unspecified: Secondary | ICD-10-CM

## 2018-10-11 DIAGNOSIS — I10 Essential (primary) hypertension: Secondary | ICD-10-CM

## 2018-10-12 DIAGNOSIS — M25572 Pain in left ankle and joints of left foot: Secondary | ICD-10-CM | POA: Diagnosis not present

## 2018-10-12 DIAGNOSIS — M25562 Pain in left knee: Secondary | ICD-10-CM | POA: Diagnosis not present

## 2018-10-12 DIAGNOSIS — R2689 Other abnormalities of gait and mobility: Secondary | ICD-10-CM | POA: Diagnosis not present

## 2018-10-19 DIAGNOSIS — M25572 Pain in left ankle and joints of left foot: Secondary | ICD-10-CM | POA: Diagnosis not present

## 2018-10-19 DIAGNOSIS — M25562 Pain in left knee: Secondary | ICD-10-CM | POA: Diagnosis not present

## 2018-10-19 DIAGNOSIS — R2689 Other abnormalities of gait and mobility: Secondary | ICD-10-CM | POA: Diagnosis not present

## 2018-10-26 DIAGNOSIS — M25572 Pain in left ankle and joints of left foot: Secondary | ICD-10-CM | POA: Diagnosis not present

## 2018-10-26 DIAGNOSIS — M25562 Pain in left knee: Secondary | ICD-10-CM | POA: Diagnosis not present

## 2018-10-26 DIAGNOSIS — R2689 Other abnormalities of gait and mobility: Secondary | ICD-10-CM | POA: Diagnosis not present

## 2018-10-31 ENCOUNTER — Ambulatory Visit: Payer: Medicare Other | Admitting: Family Medicine

## 2018-11-01 ENCOUNTER — Other Ambulatory Visit: Payer: Self-pay | Admitting: Family Medicine

## 2018-11-01 DIAGNOSIS — E7800 Pure hypercholesterolemia, unspecified: Secondary | ICD-10-CM

## 2018-11-05 DIAGNOSIS — J4520 Mild intermittent asthma, uncomplicated: Secondary | ICD-10-CM | POA: Diagnosis not present

## 2018-11-05 DIAGNOSIS — R040 Epistaxis: Secondary | ICD-10-CM | POA: Diagnosis not present

## 2018-11-06 DIAGNOSIS — K862 Cyst of pancreas: Secondary | ICD-10-CM | POA: Diagnosis not present

## 2018-11-06 DIAGNOSIS — Z9049 Acquired absence of other specified parts of digestive tract: Secondary | ICD-10-CM | POA: Diagnosis not present

## 2018-11-12 DIAGNOSIS — J340 Abscess, furuncle and carbuncle of nose: Secondary | ICD-10-CM | POA: Diagnosis not present

## 2018-11-12 DIAGNOSIS — R040 Epistaxis: Secondary | ICD-10-CM | POA: Diagnosis not present

## 2018-11-19 DIAGNOSIS — E785 Hyperlipidemia, unspecified: Secondary | ICD-10-CM | POA: Diagnosis not present

## 2018-11-19 DIAGNOSIS — E1169 Type 2 diabetes mellitus with other specified complication: Secondary | ICD-10-CM | POA: Diagnosis not present

## 2018-11-19 DIAGNOSIS — E1159 Type 2 diabetes mellitus with other circulatory complications: Secondary | ICD-10-CM | POA: Diagnosis not present

## 2018-11-19 DIAGNOSIS — Z1231 Encounter for screening mammogram for malignant neoplasm of breast: Secondary | ICD-10-CM | POA: Diagnosis not present

## 2018-11-19 DIAGNOSIS — I739 Peripheral vascular disease, unspecified: Secondary | ICD-10-CM | POA: Diagnosis not present

## 2018-11-19 DIAGNOSIS — Z794 Long term (current) use of insulin: Secondary | ICD-10-CM | POA: Diagnosis not present

## 2018-11-19 DIAGNOSIS — Z6841 Body Mass Index (BMI) 40.0 and over, adult: Secondary | ICD-10-CM | POA: Diagnosis not present

## 2018-11-19 DIAGNOSIS — N183 Chronic kidney disease, stage 3 (moderate): Secondary | ICD-10-CM | POA: Diagnosis not present

## 2018-11-19 DIAGNOSIS — I471 Supraventricular tachycardia: Secondary | ICD-10-CM | POA: Diagnosis not present

## 2018-11-19 DIAGNOSIS — E1122 Type 2 diabetes mellitus with diabetic chronic kidney disease: Secondary | ICD-10-CM | POA: Diagnosis not present

## 2018-11-19 DIAGNOSIS — I10 Essential (primary) hypertension: Secondary | ICD-10-CM | POA: Diagnosis not present

## 2018-12-10 DIAGNOSIS — J019 Acute sinusitis, unspecified: Secondary | ICD-10-CM | POA: Diagnosis not present

## 2018-12-10 DIAGNOSIS — J301 Allergic rhinitis due to pollen: Secondary | ICD-10-CM | POA: Diagnosis not present

## 2018-12-10 DIAGNOSIS — R040 Epistaxis: Secondary | ICD-10-CM | POA: Diagnosis not present

## 2019-01-17 DIAGNOSIS — E1122 Type 2 diabetes mellitus with diabetic chronic kidney disease: Secondary | ICD-10-CM | POA: Diagnosis not present

## 2019-01-17 DIAGNOSIS — E785 Hyperlipidemia, unspecified: Secondary | ICD-10-CM | POA: Diagnosis not present

## 2019-01-17 DIAGNOSIS — I10 Essential (primary) hypertension: Secondary | ICD-10-CM | POA: Diagnosis not present

## 2019-01-17 DIAGNOSIS — N183 Chronic kidney disease, stage 3 (moderate): Secondary | ICD-10-CM | POA: Diagnosis not present

## 2019-01-17 DIAGNOSIS — E1159 Type 2 diabetes mellitus with other circulatory complications: Secondary | ICD-10-CM | POA: Diagnosis not present

## 2019-01-17 DIAGNOSIS — E1169 Type 2 diabetes mellitus with other specified complication: Secondary | ICD-10-CM | POA: Diagnosis not present

## 2019-01-17 DIAGNOSIS — Z794 Long term (current) use of insulin: Secondary | ICD-10-CM | POA: Diagnosis not present

## 2019-01-17 DIAGNOSIS — E042 Nontoxic multinodular goiter: Secondary | ICD-10-CM | POA: Diagnosis not present

## 2019-01-21 ENCOUNTER — Other Ambulatory Visit: Payer: Self-pay | Admitting: Family Medicine

## 2019-01-21 DIAGNOSIS — E7800 Pure hypercholesterolemia, unspecified: Secondary | ICD-10-CM

## 2019-02-15 ENCOUNTER — Other Ambulatory Visit: Payer: Self-pay | Admitting: Family Medicine

## 2019-02-19 ENCOUNTER — Other Ambulatory Visit: Payer: Self-pay | Admitting: Family Medicine

## 2019-02-19 NOTE — Telephone Encounter (Signed)
Requested Prescriptions  Pending Prescriptions Disp Refills  . omeprazole (PRILOSEC) 20 MG capsule [Pharmacy Med Name: OMEPRAZOLE DR 20 MG CAPSULE] 90 capsule 0    Sig: TAKE 1 CAPSULE BY MOUTH EVERY DAY     Gastroenterology: Proton Pump Inhibitors Passed - 02/19/2019  8:44 AM      Passed - Valid encounter within last 12 months    Recent Outpatient Visits          6 months ago Morbid obesity [facility])   [facility] Crissman, Jeannette How, MD   8 months ago Needs flu shot   [facility] Crissman, Jeannette How, MD   10 months ago Essential hypertension   [place] Crissman, Jeannette How, MD   1 year ago Essential hypertension   [place] Crissman, Jeannette How, MD   1 year ago Essential hypertension   [place], Jeannette How, MD      Future Appointments            In 1 month [place], [place]

## 2019-02-28 DIAGNOSIS — J310 Chronic rhinitis: Secondary | ICD-10-CM | POA: Diagnosis not present

## 2019-03-19 ENCOUNTER — Other Ambulatory Visit: Payer: Self-pay | Admitting: Family Medicine

## 2019-03-19 DIAGNOSIS — E7800 Pure hypercholesterolemia, unspecified: Secondary | ICD-10-CM

## 2019-03-19 DIAGNOSIS — I10 Essential (primary) hypertension: Secondary | ICD-10-CM

## 2019-03-25 ENCOUNTER — Other Ambulatory Visit: Payer: Self-pay | Admitting: Family Medicine

## 2019-03-25 DIAGNOSIS — I10 Essential (primary) hypertension: Secondary | ICD-10-CM

## 2019-03-26 DIAGNOSIS — I340 Nonrheumatic mitral (valve) insufficiency: Secondary | ICD-10-CM | POA: Diagnosis not present

## 2019-03-26 DIAGNOSIS — I779 Disorder of arteries and arterioles, unspecified: Secondary | ICD-10-CM | POA: Diagnosis not present

## 2019-03-26 DIAGNOSIS — I10 Essential (primary) hypertension: Secondary | ICD-10-CM | POA: Diagnosis not present

## 2019-03-26 DIAGNOSIS — I471 Supraventricular tachycardia: Secondary | ICD-10-CM | POA: Diagnosis not present

## 2019-03-26 DIAGNOSIS — E1159 Type 2 diabetes mellitus with other circulatory complications: Secondary | ICD-10-CM | POA: Diagnosis not present

## 2019-04-01 ENCOUNTER — Ambulatory Visit: Payer: Medicare Other

## 2019-04-11 DIAGNOSIS — Z0000 Encounter for general adult medical examination without abnormal findings: Secondary | ICD-10-CM | POA: Diagnosis not present

## 2019-04-24 DIAGNOSIS — Z8371 Family history of colonic polyps: Secondary | ICD-10-CM | POA: Diagnosis not present

## 2019-04-24 DIAGNOSIS — R195 Other fecal abnormalities: Secondary | ICD-10-CM | POA: Diagnosis not present

## 2019-04-24 DIAGNOSIS — Z6841 Body Mass Index (BMI) 40.0 and over, adult: Secondary | ICD-10-CM | POA: Diagnosis not present

## 2019-04-24 DIAGNOSIS — K862 Cyst of pancreas: Secondary | ICD-10-CM | POA: Diagnosis not present

## 2019-04-24 DIAGNOSIS — N183 Chronic kidney disease, stage 3 (moderate): Secondary | ICD-10-CM | POA: Diagnosis not present

## 2019-04-24 DIAGNOSIS — I779 Disorder of arteries and arterioles, unspecified: Secondary | ICD-10-CM | POA: Diagnosis not present

## 2019-04-24 DIAGNOSIS — Z794 Long term (current) use of insulin: Secondary | ICD-10-CM | POA: Diagnosis not present

## 2019-04-24 DIAGNOSIS — E1122 Type 2 diabetes mellitus with diabetic chronic kidney disease: Secondary | ICD-10-CM | POA: Diagnosis not present

## 2019-05-13 DIAGNOSIS — J4520 Mild intermittent asthma, uncomplicated: Secondary | ICD-10-CM | POA: Diagnosis not present

## 2019-05-13 DIAGNOSIS — R0600 Dyspnea, unspecified: Secondary | ICD-10-CM | POA: Diagnosis not present

## 2019-05-13 DIAGNOSIS — R05 Cough: Secondary | ICD-10-CM | POA: Diagnosis not present

## 2019-05-15 ENCOUNTER — Other Ambulatory Visit: Payer: Self-pay | Admitting: Specialist

## 2019-05-15 ENCOUNTER — Other Ambulatory Visit (HOSPITAL_COMMUNITY): Payer: Self-pay | Admitting: Specialist

## 2019-05-15 DIAGNOSIS — R0609 Other forms of dyspnea: Secondary | ICD-10-CM

## 2019-05-15 DIAGNOSIS — R053 Chronic cough: Secondary | ICD-10-CM

## 2019-05-15 DIAGNOSIS — R05 Cough: Secondary | ICD-10-CM

## 2019-05-22 ENCOUNTER — Other Ambulatory Visit: Payer: Self-pay

## 2019-05-22 ENCOUNTER — Ambulatory Visit
Admission: RE | Admit: 2019-05-22 | Discharge: 2019-05-22 | Disposition: A | Discharge status: Home / Self Care | Payer: Medicare Other | Source: Ambulatory Visit | Attending: Specialist | Admitting: Specialist

## 2019-05-22 DIAGNOSIS — R0609 Other forms of dyspnea: Secondary | ICD-10-CM | POA: Insufficient documentation

## 2019-05-22 DIAGNOSIS — E1169 Type 2 diabetes mellitus with other specified complication: Secondary | ICD-10-CM | POA: Diagnosis not present

## 2019-05-22 DIAGNOSIS — R05 Cough: Secondary | ICD-10-CM | POA: Insufficient documentation

## 2019-05-22 DIAGNOSIS — E1159 Type 2 diabetes mellitus with other circulatory complications: Secondary | ICD-10-CM | POA: Diagnosis not present

## 2019-05-22 DIAGNOSIS — I10 Essential (primary) hypertension: Secondary | ICD-10-CM | POA: Diagnosis not present

## 2019-05-22 DIAGNOSIS — J4520 Mild intermittent asthma, uncomplicated: Secondary | ICD-10-CM | POA: Diagnosis not present

## 2019-05-22 DIAGNOSIS — E042 Nontoxic multinodular goiter: Secondary | ICD-10-CM | POA: Diagnosis not present

## 2019-05-22 DIAGNOSIS — Z6841 Body Mass Index (BMI) 40.0 and over, adult: Secondary | ICD-10-CM | POA: Diagnosis not present

## 2019-05-22 DIAGNOSIS — I471 Supraventricular tachycardia: Secondary | ICD-10-CM | POA: Diagnosis not present

## 2019-05-22 DIAGNOSIS — I779 Disorder of arteries and arterioles, unspecified: Secondary | ICD-10-CM | POA: Diagnosis not present

## 2019-05-22 DIAGNOSIS — E785 Hyperlipidemia, unspecified: Secondary | ICD-10-CM | POA: Diagnosis not present

## 2019-05-22 DIAGNOSIS — E559 Vitamin D deficiency, unspecified: Secondary | ICD-10-CM | POA: Diagnosis not present

## 2019-05-22 DIAGNOSIS — Z794 Long term (current) use of insulin: Secondary | ICD-10-CM | POA: Diagnosis not present

## 2019-05-22 DIAGNOSIS — E1122 Type 2 diabetes mellitus with diabetic chronic kidney disease: Secondary | ICD-10-CM | POA: Diagnosis not present

## 2019-05-22 DIAGNOSIS — N183 Chronic kidney disease, stage 3 (moderate): Secondary | ICD-10-CM | POA: Diagnosis not present

## 2019-05-22 DIAGNOSIS — Z0000 Encounter for general adult medical examination without abnormal findings: Secondary | ICD-10-CM | POA: Diagnosis not present

## 2019-05-22 DIAGNOSIS — R053 Chronic cough: Secondary | ICD-10-CM

## 2019-05-27 DIAGNOSIS — M2392 Unspecified internal derangement of left knee: Secondary | ICD-10-CM | POA: Diagnosis not present

## 2019-05-29 DIAGNOSIS — E1122 Type 2 diabetes mellitus with diabetic chronic kidney disease: Secondary | ICD-10-CM | POA: Diagnosis not present

## 2019-05-29 DIAGNOSIS — Z794 Long term (current) use of insulin: Secondary | ICD-10-CM | POA: Diagnosis not present

## 2019-05-29 DIAGNOSIS — E042 Nontoxic multinodular goiter: Secondary | ICD-10-CM | POA: Diagnosis not present

## 2019-05-29 DIAGNOSIS — N183 Chronic kidney disease, stage 3 (moderate): Secondary | ICD-10-CM | POA: Diagnosis not present

## 2019-05-29 DIAGNOSIS — E785 Hyperlipidemia, unspecified: Secondary | ICD-10-CM | POA: Diagnosis not present

## 2019-05-29 DIAGNOSIS — E1169 Type 2 diabetes mellitus with other specified complication: Secondary | ICD-10-CM | POA: Diagnosis not present

## 2019-05-29 DIAGNOSIS — I10 Essential (primary) hypertension: Secondary | ICD-10-CM | POA: Diagnosis not present

## 2019-05-29 DIAGNOSIS — E1159 Type 2 diabetes mellitus with other circulatory complications: Secondary | ICD-10-CM | POA: Diagnosis not present

## 2019-05-30 DIAGNOSIS — Z23 Encounter for immunization: Secondary | ICD-10-CM | POA: Diagnosis not present

## 2019-06-05 ENCOUNTER — Other Ambulatory Visit: Payer: Self-pay | Admitting: Family Medicine

## 2019-06-05 DIAGNOSIS — E7800 Pure hypercholesterolemia, unspecified: Secondary | ICD-10-CM

## 2019-06-05 NOTE — Telephone Encounter (Signed)
Requested medication (s) are due for refill today: yes  Requested medication (s) are on the active medication list: yes  Last refill:  03/18/2019 Future visit scheduled: no  Notes to clinic:  Review for refill  Requested Prescriptions  Pending Prescriptions Disp Refills   WELCHOL 625 MG tablet [Pharmacy Med Name: [facility] TAB 625MG ] 540 tablet 1    Sig: TAKE 3 TABLETS (=1875MG )   TWO TIMES A DAY WITH MEALS     Cardiovascular:  Antilipid - Bile Acid Sequestrants Failed - 06/05/2019  9:09 AM      Failed - Total Cholesterol in normal range and within 360 days    Cholesterol Piccolo, Waived  Date Value Ref Range Status  04/03/2018 147 <200 mg/dL Final    Comment:                            Desirable                <200                         Borderline High      200- 239                         High                     >239          Failed - LDL in normal range and within 360 days    LDL Calculated  Date Value Ref Range Status  09/18/2017 55 0 - 99 mg/dL Final         Failed - HDL in normal range and within 360 days    HDL  Date Value Ref Range Status  09/18/2017 56 >39 mg/dL Final         Failed - Triglycerides in normal range and within 360 days    Triglycerides Piccolo,Waived  Date Value Ref Range Status  04/03/2018 207 (H) <150 mg/dL Final    Comment:                            Normal                   <150                         Borderline High     150 - 199                         High                200 - 499                         Very High                >499          Passed - Valid encounter within last 12 months    Recent Outpatient Visits          10 months ago Morbid obesity ([place])   [facility] Crissman, Jeannette How, MD   12 months ago Needs flu shot   [facility] Crissman, Jeannette How, MD   1 year ago  Essential hypertension   [facility] Crissman, Jeannette How, MD   1 year ago Essential hypertension   [place], Jeannette How, MD   2 years ago Essential hypertension   [facility] Crissman, Jeannette How, MD

## 2019-06-06 DIAGNOSIS — J4520 Mild intermittent asthma, uncomplicated: Secondary | ICD-10-CM | POA: Diagnosis not present

## 2019-06-06 DIAGNOSIS — R0600 Dyspnea, unspecified: Secondary | ICD-10-CM | POA: Diagnosis not present

## 2019-06-06 DIAGNOSIS — I071 Rheumatic tricuspid insufficiency: Secondary | ICD-10-CM | POA: Diagnosis not present

## 2019-06-25 ENCOUNTER — Other Ambulatory Visit: Payer: Self-pay | Admitting: Family Medicine

## 2019-06-25 DIAGNOSIS — E7800 Pure hypercholesterolemia, unspecified: Secondary | ICD-10-CM

## 2019-06-25 DIAGNOSIS — I10 Essential (primary) hypertension: Secondary | ICD-10-CM

## 2019-06-26 NOTE — Telephone Encounter (Signed)
Requested medication (s) are due for refill today: yes  Requested medication (s) are on the active medication list: yes  Last refill:  06/11/2019  Future visit scheduled: no  Notes to clinic: review for refill   Requested Prescriptions  Pending Prescriptions Disp Refills   atenolol (TENORMIN) 100 MG tablet [Pharmacy Med Name: ATENOLOL TAB 100MG ] 90 tablet 3    Sig: TAKE 1 TABLET DAILY     Cardiovascular:  Beta Blockers Failed - 06/25/2019  9:08 PM      Failed - Valid encounter within last 6 months    Recent Outpatient Visits          10 months ago Morbid obesity ([place])   [facility] Crissman, Jeannette How, MD   1 year ago Needs flu shot   [facility] Crissman, Jeannette How, MD   1 year ago Essential hypertension   [place] Crissman, Jeannette How, MD   1 year ago Essential hypertension   [place], Jeannette How, MD   2 years ago Essential hypertension   [place], Jeannette How, MD             Passed - Last BP in normal range    BP Readings from Last 1 Encounters:  08/08/18 110/69         Passed - Last Heart Rate in normal range    Pulse Readings from Last 1 Encounters:  08/08/18 70          DILT-XR 240 MG 24 hr capsule [Pharmacy Med Name: DILT-XR CAP 240MG /24] 90 capsule 0    Sig: TAKE 1 CAPSULE DAILY     Cardiovascular:  Calcium Channel Blockers Failed - 06/25/2019  9:08 PM      Failed - Valid encounter within last 6 months    Recent Outpatient Visits          10 months ago Morbid obesity ([place])   [facility] Crissman, Jeannette How, MD   1 year ago Needs flu shot   [facility] Crissman, Jeannette How, MD   1 year ago Essential hypertension   [place] Crissman, Jeannette How, MD   1 year ago Essential hypertension   [place], Jeannette How, MD   2 years ago Essential hypertension   [place], Jeannette How, MD             Passed - Last BP  in normal range    BP Readings from Last 1 Encounters:  08/08/18 110/69          atorvastatin (LIPITOR) 20 MG tablet [Pharmacy Med Name: ATORVASTATIN TAB 20MG ] 90 tablet 0    Sig: TAKE 1 TABLET DAILY     Cardiovascular:  Antilipid - Statins Failed - 06/25/2019  9:08 PM      Failed - Total Cholesterol in normal range and within 360 days    Cholesterol Piccolo, Waived  Date Value Ref Range Status  04/03/2018 147 <200 mg/dL Final    Comment:                            Desirable                <200                         Borderline High      200- 239  High                     >239          Failed - LDL in normal range and within 360 days    LDL Calculated  Date Value Ref Range Status  09/18/2017 55 0 - 99 mg/dL Final         Failed - HDL in normal range and within 360 days    HDL  Date Value Ref Range Status  09/18/2017 56 >39 mg/dL Final         Failed - Triglycerides in normal range and within 360 days    Triglycerides Piccolo,Waived  Date Value Ref Range Status  04/03/2018 207 (H) <150 mg/dL Final    Comment:                            Normal                   <150                         Borderline High     150 - 199                         High                200 - 499                         Very High                >499          Passed - Patient is not pregnant      Passed - Valid encounter within last 12 months    Recent Outpatient Visits          10 months ago Morbid obesity ([place])   [facility] Crissman, Jeannette How, MD   1 year ago Needs flu shot   [facility] Crissman, Jeannette How, MD   1 year ago Essential hypertension   [facility] Crissman, Jeannette How, MD   1 year ago Essential hypertension   [facility] Crissman, Jeannette How, MD   2 years ago Essential hypertension   [facility] Crissman, Jeannette How, MD

## 2019-07-09 DIAGNOSIS — E119 Type 2 diabetes mellitus without complications: Secondary | ICD-10-CM | POA: Diagnosis not present

## 2019-08-15 DIAGNOSIS — J301 Allergic rhinitis due to pollen: Secondary | ICD-10-CM | POA: Diagnosis not present

## 2019-08-15 DIAGNOSIS — J3089 Other allergic rhinitis: Secondary | ICD-10-CM | POA: Diagnosis not present

## 2019-08-15 DIAGNOSIS — J309 Allergic rhinitis, unspecified: Secondary | ICD-10-CM | POA: Diagnosis not present

## 2019-08-15 DIAGNOSIS — R05 Cough: Secondary | ICD-10-CM | POA: Diagnosis not present

## 2019-11-13 IMAGING — DX DG CHEST 2V
2 series · 4 of 4 positions shown · non-contrast
Comparison: February 23, 2011

CLINICAL DATA: Chronic cough for a year.

EXAM:
CHEST - 2 VIEW

[Series 1: chest pa · 0.14mm/px · 2 of 2 slices shown]
[im 1/2]
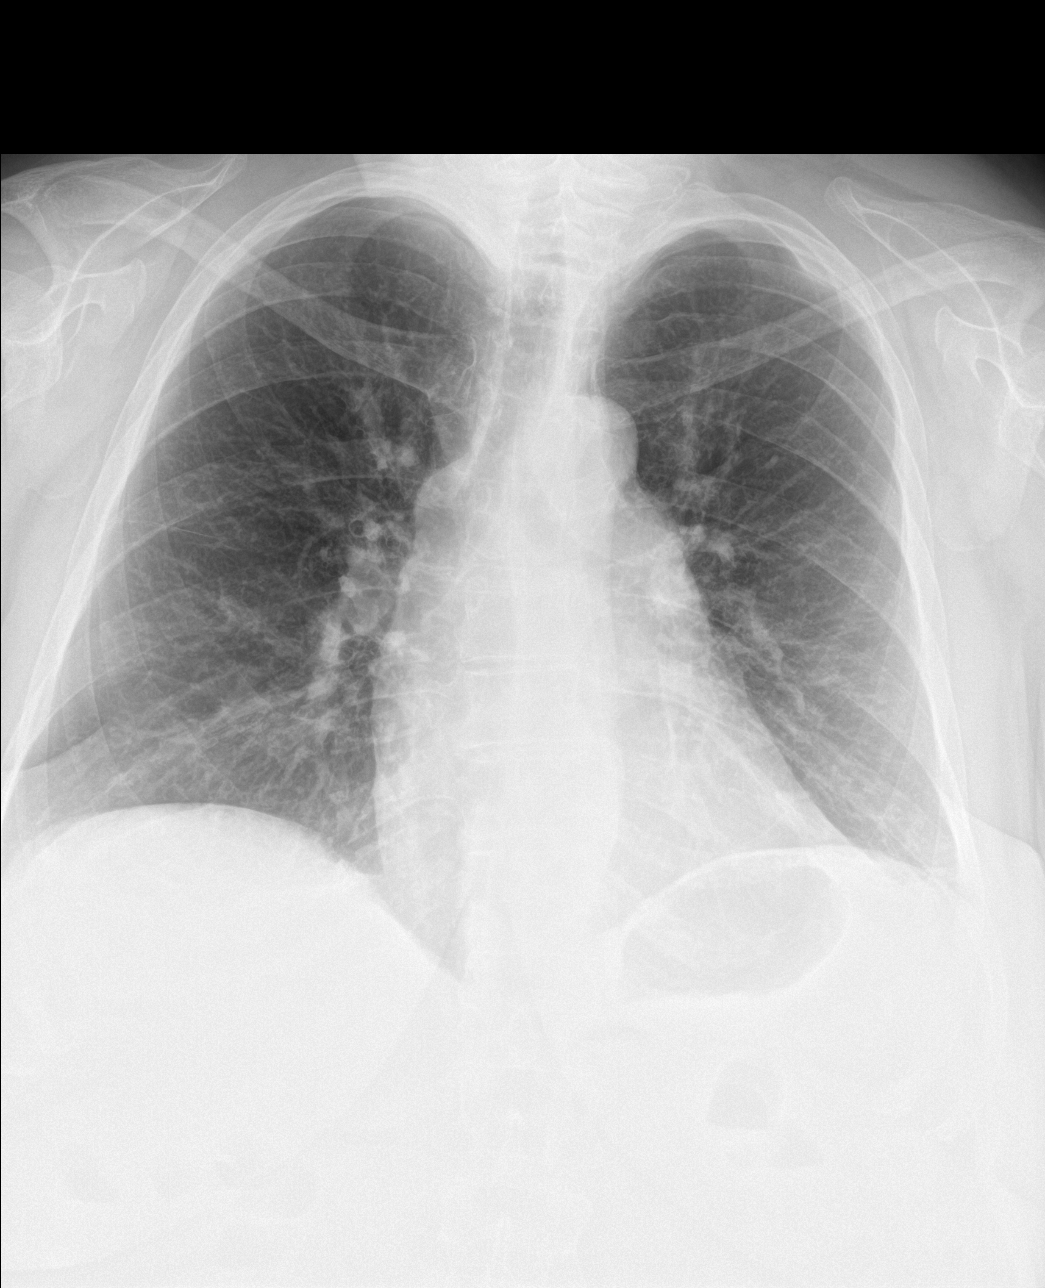
[im 2/2]
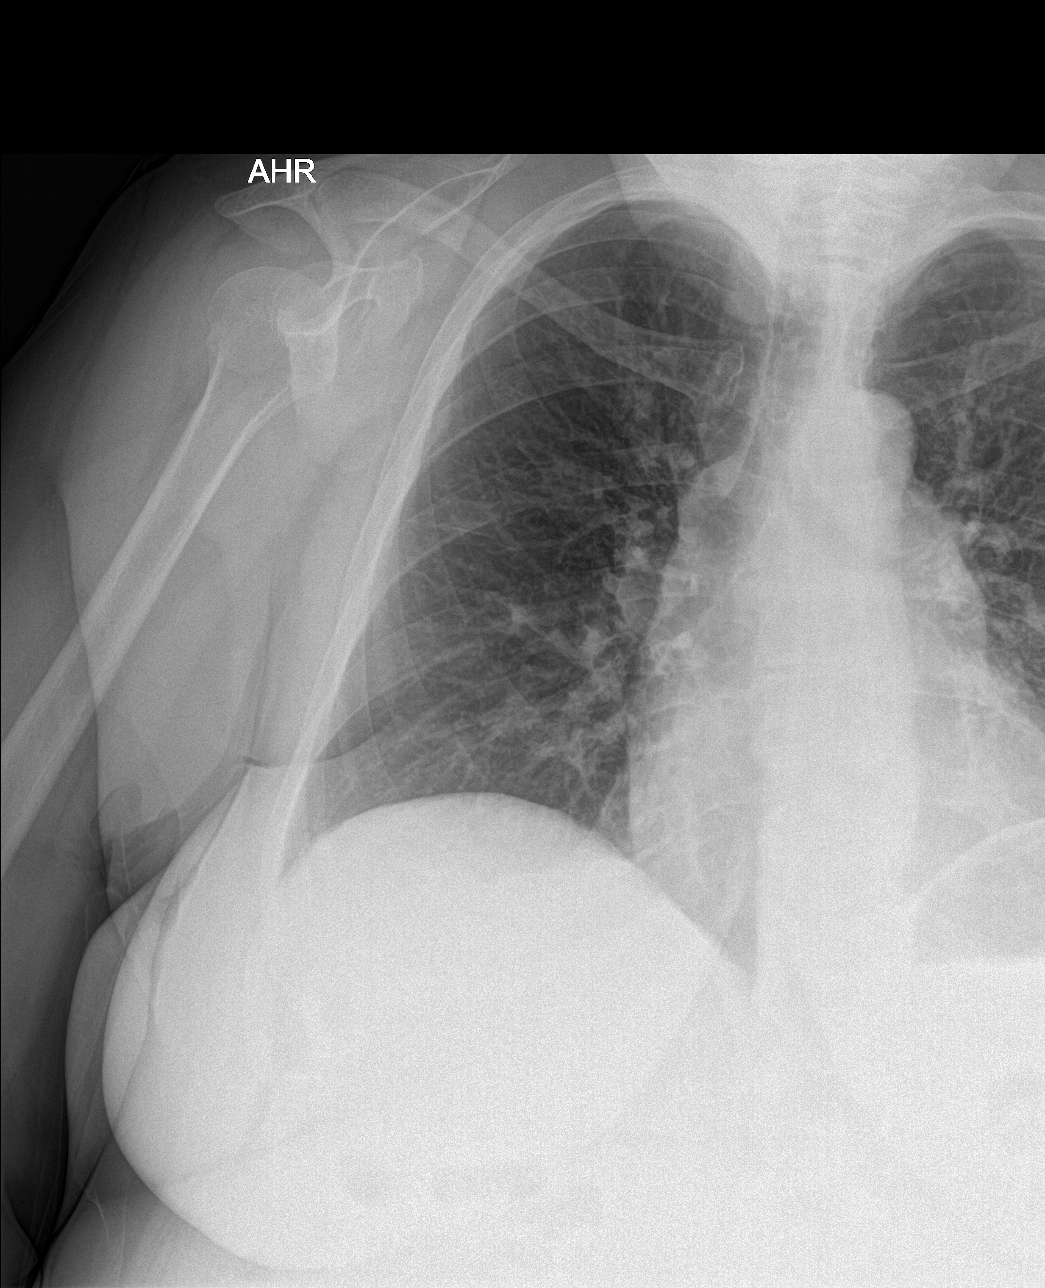

[Series 2: chest lat · 0.14mm/px · 2 of 2 slices shown]
[im 1/2]
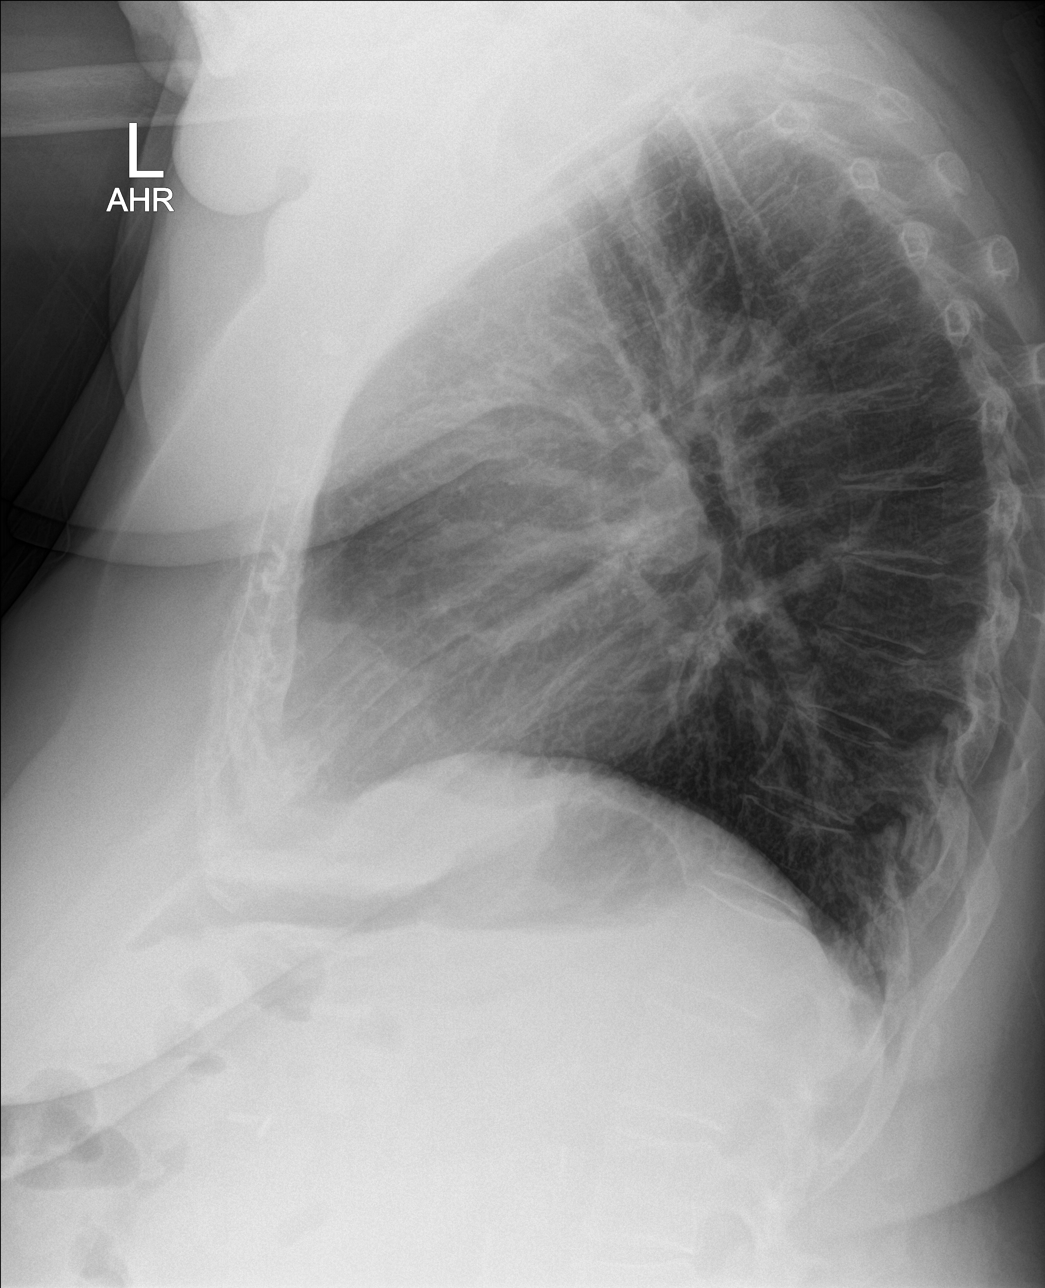
[im 2/2]
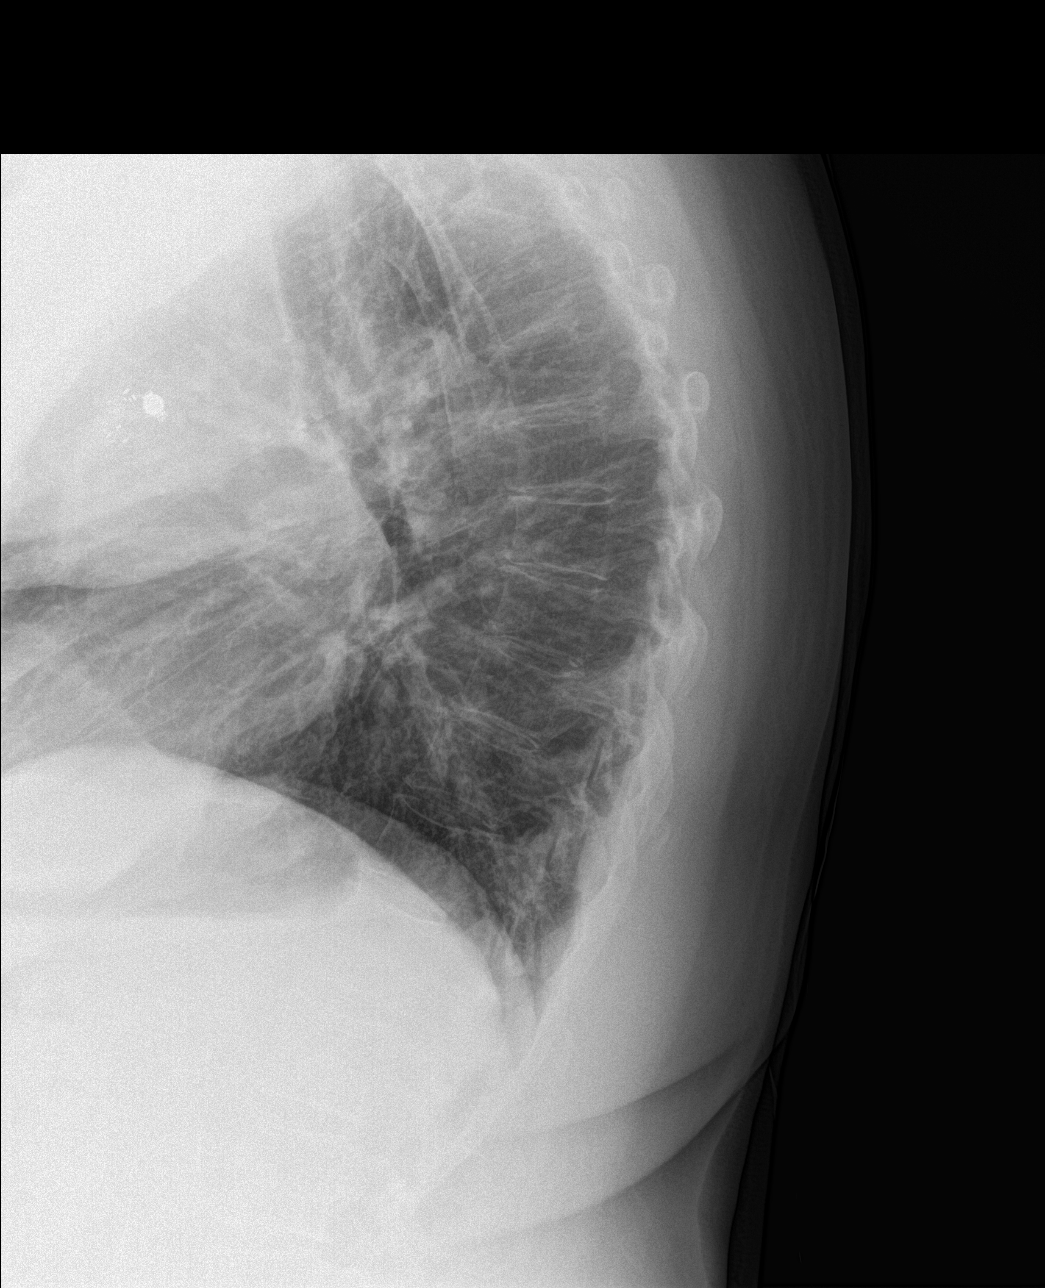

[4 of 4 positions shown; findings below may reference images not displayed]

FINDINGS: No pneumothorax. The heart size is borderline to mildly enlarged.
The hila and mediastinum are normal. No pulmonary nodules or masses.
No focal infiltrates.
IMPRESSION: No acute abnormalities. No cause for the patient's symptoms
identified.

## 2020-06-01 LAB — COLOGUARD: COLOGUARD: NEGATIVE

## 2022-06-01 ENCOUNTER — Other Ambulatory Visit: Payer: Self-pay | Admitting: Internal Medicine

## 2022-06-01 DIAGNOSIS — Z1231 Encounter for screening mammogram for malignant neoplasm of breast: Secondary | ICD-10-CM

## 2022-06-30 ENCOUNTER — Ambulatory Visit
Admission: RE | Admit: 2022-06-30 | Discharge: 2022-06-30 | Disposition: A | Discharge status: Home / Self Care | Payer: Medicare Other | Source: Ambulatory Visit | Attending: Internal Medicine | Admitting: Internal Medicine

## 2022-06-30 ENCOUNTER — Other Ambulatory Visit: Payer: Self-pay | Admitting: Specialist

## 2022-06-30 DIAGNOSIS — R0602 Shortness of breath: Secondary | ICD-10-CM

## 2022-06-30 DIAGNOSIS — R0902 Hypoxemia: Secondary | ICD-10-CM

## 2022-06-30 DIAGNOSIS — Z1231 Encounter for screening mammogram for malignant neoplasm of breast: Secondary | ICD-10-CM | POA: Insufficient documentation

## 2022-07-06 ENCOUNTER — Other Ambulatory Visit: Payer: Self-pay | Admitting: Internal Medicine

## 2022-07-06 DIAGNOSIS — R928 Other abnormal and inconclusive findings on diagnostic imaging of breast: Secondary | ICD-10-CM

## 2022-07-06 DIAGNOSIS — N6489 Other specified disorders of breast: Secondary | ICD-10-CM

## 2022-07-08 ENCOUNTER — Ambulatory Visit
Admission: RE | Admit: 2022-07-08 | Discharge: 2022-07-08 | Disposition: A | Discharge status: Home / Self Care | Payer: Medicare Other | Source: Ambulatory Visit | Attending: Specialist | Admitting: Specialist

## 2022-07-08 DIAGNOSIS — R0902 Hypoxemia: Secondary | ICD-10-CM

## 2022-07-08 DIAGNOSIS — R0602 Shortness of breath: Secondary | ICD-10-CM

## 2022-07-25 ENCOUNTER — Ambulatory Visit
Admission: RE | Admit: 2022-07-25 | Discharge: 2022-07-25 | Disposition: A | Discharge status: Home / Self Care | Payer: Medicare Other | Source: Ambulatory Visit | Attending: Internal Medicine | Admitting: Internal Medicine

## 2022-07-25 DIAGNOSIS — N6489 Other specified disorders of breast: Secondary | ICD-10-CM

## 2022-07-25 DIAGNOSIS — R928 Other abnormal and inconclusive findings on diagnostic imaging of breast: Secondary | ICD-10-CM

## 2022-08-12 ENCOUNTER — Other Ambulatory Visit: Payer: Self-pay | Admitting: Specialist

## 2022-08-12 DIAGNOSIS — R911 Solitary pulmonary nodule: Secondary | ICD-10-CM

## 2022-08-22 ENCOUNTER — Ambulatory Visit
Admission: RE | Admit: 2022-08-22 | Discharge: 2022-08-22 | Disposition: A | Discharge status: Home / Self Care | Payer: Medicare Other | Source: Ambulatory Visit | Attending: Specialist | Admitting: Specialist

## 2022-08-22 DIAGNOSIS — R911 Solitary pulmonary nodule: Secondary | ICD-10-CM | POA: Diagnosis present

## 2022-08-22 LAB — GLUCOSE, CAPILLARY: Glucose-Capillary: 115 mg/dL — ABNORMAL HIGH (ref 70–99)

## 2022-08-22 MED ORDER — FLUDEOXYGLUCOSE F - 18 (FDG) INJECTION
12.2800 | Freq: Once | INTRAVENOUS | Status: AC | PRN
  Administered 2022-08-22: 12.28 via INTRAVENOUS

## 2022-09-13 ENCOUNTER — Other Ambulatory Visit: Payer: Self-pay | Admitting: Specialist

## 2022-09-13 DIAGNOSIS — R0602 Shortness of breath: Secondary | ICD-10-CM

## 2022-09-13 DIAGNOSIS — R911 Solitary pulmonary nodule: Secondary | ICD-10-CM

## 2022-09-23 ENCOUNTER — Ambulatory Visit: Payer: Medicare Other

## 2023-06-29 ENCOUNTER — Other Ambulatory Visit: Payer: Self-pay | Admitting: Specialist

## 2023-06-29 DIAGNOSIS — R918 Other nonspecific abnormal finding of lung field: Secondary | ICD-10-CM

## 2023-07-15 LAB — COLOGUARD: COLOGUARD: NEGATIVE

## 2023-07-15 LAB — EXTERNAL GENERIC LAB PROCEDURE: COLOGUARD: NEGATIVE

## 2023-07-17 ENCOUNTER — Ambulatory Visit
Admission: RE | Admit: 2023-07-17 | Discharge: 2023-07-17 | Disposition: A | Discharge status: Home / Self Care | Payer: Medicare Other | Source: Ambulatory Visit | Attending: Specialist | Admitting: Specialist

## 2023-07-17 DIAGNOSIS — R918 Other nonspecific abnormal finding of lung field: Secondary | ICD-10-CM | POA: Insufficient documentation

## 2023-07-20 ENCOUNTER — Other Ambulatory Visit: Payer: Self-pay | Admitting: Internal Medicine

## 2023-07-20 DIAGNOSIS — Z1231 Encounter for screening mammogram for malignant neoplasm of breast: Secondary | ICD-10-CM

## 2023-08-14 ENCOUNTER — Ambulatory Visit
Admission: RE | Admit: 2023-08-14 | Discharge: 2023-08-14 | Disposition: A | Discharge status: Home / Self Care | Payer: Medicare Other | Source: Ambulatory Visit | Attending: Internal Medicine | Admitting: Internal Medicine

## 2023-08-14 DIAGNOSIS — Z1231 Encounter for screening mammogram for malignant neoplasm of breast: Secondary | ICD-10-CM | POA: Insufficient documentation

## 2023-08-22 ENCOUNTER — Other Ambulatory Visit: Payer: Self-pay | Admitting: Internal Medicine

## 2023-08-22 DIAGNOSIS — R928 Other abnormal and inconclusive findings on diagnostic imaging of breast: Secondary | ICD-10-CM

## 2023-09-04 ENCOUNTER — Ambulatory Visit
Admission: RE | Admit: 2023-09-04 | Discharge: 2023-09-04 | Disposition: A | Discharge status: Home / Self Care | Payer: Medicare Other | Source: Ambulatory Visit | Attending: Internal Medicine | Admitting: Internal Medicine

## 2023-09-04 DIAGNOSIS — R928 Other abnormal and inconclusive findings on diagnostic imaging of breast: Secondary | ICD-10-CM | POA: Diagnosis present

## 2024-08-16 ENCOUNTER — Other Ambulatory Visit: Payer: Self-pay | Admitting: Internal Medicine

## 2024-08-16 DIAGNOSIS — Z1231 Encounter for screening mammogram for malignant neoplasm of breast: Secondary | ICD-10-CM

## 2024-09-23 ENCOUNTER — Ambulatory Visit
Admission: RE | Admit: 2024-09-23 | Discharge: 2024-09-23 | Disposition: A | Discharge status: Home / Self Care | Source: Ambulatory Visit | Attending: Internal Medicine | Admitting: Internal Medicine

## 2024-09-23 DIAGNOSIS — Z1231 Encounter for screening mammogram for malignant neoplasm of breast: Secondary | ICD-10-CM | POA: Insufficient documentation
